# Patient Record
Sex: Male | Born: 1935 | Race: White | Hispanic: No | Marital: Married | Smoking: Never smoker
Health system: Southern US, Community
[De-identification: ages and names within clinical notes are randomized; demographics above are authoritative.]

## PROBLEM LIST (undated history)

## (undated) DIAGNOSIS — E7800 Pure hypercholesterolemia, unspecified: Secondary | ICD-10-CM

## (undated) DIAGNOSIS — I639 Cerebral infarction, unspecified: Secondary | ICD-10-CM

## (undated) DIAGNOSIS — K219 Gastro-esophageal reflux disease without esophagitis: Secondary | ICD-10-CM

## (undated) DIAGNOSIS — I4891 Unspecified atrial fibrillation: Secondary | ICD-10-CM

## (undated) DIAGNOSIS — R011 Cardiac murmur, unspecified: Secondary | ICD-10-CM

## (undated) DIAGNOSIS — Z974 Presence of external hearing-aid: Secondary | ICD-10-CM

## (undated) DIAGNOSIS — T753XXA Motion sickness, initial encounter: Secondary | ICD-10-CM

## (undated) DIAGNOSIS — J45909 Unspecified asthma, uncomplicated: Secondary | ICD-10-CM

## (undated) DIAGNOSIS — M1990 Unspecified osteoarthritis, unspecified site: Secondary | ICD-10-CM

## (undated) HISTORY — PX: LID LESION EXCISION: SHX5204

## (undated) HISTORY — PX: COLONOSCOPY: SHX174

---

## 2007-03-31 ENCOUNTER — Ambulatory Visit: Payer: Self-pay | Admitting: Internal Medicine

## 2008-02-21 ENCOUNTER — Ambulatory Visit: Payer: Self-pay | Admitting: Internal Medicine

## 2010-03-23 HISTORY — PX: TOTAL HIP ARTHROPLASTY: SHX124

## 2013-03-23 DIAGNOSIS — I639 Cerebral infarction, unspecified: Secondary | ICD-10-CM

## 2013-03-23 HISTORY — DX: Cerebral infarction, unspecified: I63.9

## 2015-10-08 ENCOUNTER — Encounter: Payer: Self-pay | Admitting: *Deleted

## 2015-10-11 NOTE — Discharge Instructions (Signed)

## 2015-10-14 ENCOUNTER — Encounter
Admission: RE | Disposition: A | Discharge status: Home / Self Care | Payer: Self-pay | Source: Ambulatory Visit | Attending: Ophthalmology

## 2015-10-14 ENCOUNTER — Ambulatory Visit
Admission: RE | Admit: 2015-10-14 | Discharge: 2015-10-14 | Disposition: A | Discharge status: Home / Self Care | Payer: Medicare HMO | Source: Ambulatory Visit | Attending: Ophthalmology | Admitting: Ophthalmology

## 2015-10-14 ENCOUNTER — Ambulatory Visit: Payer: Medicare HMO | Admitting: Student in an Organized Health Care Education/Training Program

## 2015-10-14 DIAGNOSIS — M1990 Unspecified osteoarthritis, unspecified site: Secondary | ICD-10-CM | POA: Diagnosis not present

## 2015-10-14 DIAGNOSIS — K219 Gastro-esophageal reflux disease without esophagitis: Secondary | ICD-10-CM | POA: Insufficient documentation

## 2015-10-14 DIAGNOSIS — I4891 Unspecified atrial fibrillation: Secondary | ICD-10-CM | POA: Diagnosis not present

## 2015-10-14 DIAGNOSIS — Z8673 Personal history of transient ischemic attack (TIA), and cerebral infarction without residual deficits: Secondary | ICD-10-CM | POA: Diagnosis not present

## 2015-10-14 DIAGNOSIS — H2512 Age-related nuclear cataract, left eye: Secondary | ICD-10-CM | POA: Insufficient documentation

## 2015-10-14 DIAGNOSIS — E7800 Pure hypercholesterolemia, unspecified: Secondary | ICD-10-CM | POA: Insufficient documentation

## 2015-10-14 DIAGNOSIS — Z7901 Long term (current) use of anticoagulants: Secondary | ICD-10-CM | POA: Insufficient documentation

## 2015-10-14 HISTORY — DX: Cerebral infarction, unspecified: I63.9

## 2015-10-14 HISTORY — DX: Motion sickness, initial encounter: T75.3XXA

## 2015-10-14 HISTORY — DX: Unspecified asthma, uncomplicated: J45.909

## 2015-10-14 HISTORY — DX: Unspecified atrial fibrillation: I48.91

## 2015-10-14 HISTORY — DX: Pure hypercholesterolemia, unspecified: E78.00

## 2015-10-14 HISTORY — DX: Unspecified osteoarthritis, unspecified site: M19.90

## 2015-10-14 HISTORY — DX: Gastro-esophageal reflux disease without esophagitis: K21.9

## 2015-10-14 HISTORY — PX: CATARACT EXTRACTION W/PHACO: SHX586

## 2015-10-14 HISTORY — DX: Cardiac murmur, unspecified: R01.1

## 2015-10-14 SURGERY — PHACOEMULSIFICATION, CATARACT, WITH IOL INSERTION
Anesthesia: Monitor Anesthesia Care | Site: Eye | Laterality: Left | Wound class: Clean

## 2015-10-14 MED ORDER — CEFUROXIME OPHTHALMIC INJECTION 1 MG/0.1 ML
INJECTION | OPHTHALMIC | Status: DC | PRN
  Administered 2015-10-14: 0.1 mL via INTRACAMERAL

## 2015-10-14 MED ORDER — EPINEPHRINE HCL 1 MG/ML IJ SOLN
INTRAOCULAR | Status: DC | PRN
  Administered 2015-10-14: 103 mL via OPHTHALMIC
  Administered 2015-10-14: 11:00:00 via OPHTHALMIC

## 2015-10-14 MED ORDER — ARMC OPHTHALMIC DILATING GEL
1.0000 | OPHTHALMIC | Status: DC | PRN
Start: 2015-10-14 — End: 2015-10-14
  Administered 2015-10-14 (×2): 1 via OPHTHALMIC

## 2015-10-14 MED ORDER — LACTATED RINGERS IV SOLN: INTRAVENOUS | Status: DC

## 2015-10-14 MED ORDER — MIDAZOLAM HCL 2 MG/2ML IJ SOLN
INTRAMUSCULAR | Status: DC | PRN
  Administered 2015-10-14: 2 mg via INTRAVENOUS

## 2015-10-14 MED ORDER — BRIMONIDINE TARTRATE 0.2 % OP SOLN
OPHTHALMIC | Status: DC | PRN
  Administered 2015-10-14: 1 [drp] via OPHTHALMIC

## 2015-10-14 MED ORDER — POVIDONE-IODINE 5 % OP SOLN
1.0000 "application " | OPHTHALMIC | Status: DC | PRN
  Administered 2015-10-14: 1 via OPHTHALMIC

## 2015-10-14 MED ORDER — TETRACAINE HCL 0.5 % OP SOLN
1.0000 [drp] | OPHTHALMIC | Status: DC | PRN
  Administered 2015-10-14: 1 [drp] via OPHTHALMIC

## 2015-10-14 MED ORDER — NA HYALUR & NA CHOND-NA HYALUR 0.4-0.35 ML IO KIT
PACK | INTRAOCULAR | Status: DC | PRN
  Administered 2015-10-14: 1 mL via INTRAOCULAR

## 2015-10-14 MED ORDER — TIMOLOL MALEATE 0.5 % OP SOLN
OPHTHALMIC | Status: DC | PRN
  Administered 2015-10-14: 1 [drp] via OPHTHALMIC

## 2015-10-14 MED ORDER — BALANCED SALT IO SOLN
INTRAOCULAR | Status: DC | PRN
  Administered 2015-10-14: 1 mL via OPHTHALMIC

## 2015-10-14 MED ORDER — FENTANYL CITRATE (PF) 100 MCG/2ML IJ SOLN
INTRAMUSCULAR | Status: DC | PRN
  Administered 2015-10-14: 50 ug via INTRAVENOUS

## 2015-10-14 SURGICAL SUPPLY — 29 items
APPLICATOR COTTON TIP 3IN (MISCELLANEOUS) ×3 IMPLANT
CANNULA ANT/CHMB 27GA (MISCELLANEOUS) IMPLANT
DISSECTOR HYDRO NUCLEUS 50X22 (MISCELLANEOUS) ×3 IMPLANT
GLOVE BIO SURGEON STRL SZ7 (GLOVE) ×3 IMPLANT
GLOVE SURG LX 6.5 MICRO (GLOVE) ×2
GLOVE SURG LX STRL 6.5 MICRO (GLOVE) ×1 IMPLANT
GOWN STRL REUS W/ TWL LRG LVL3 (GOWN DISPOSABLE) ×2 IMPLANT
GOWN STRL REUS W/TWL LRG LVL3 (GOWN DISPOSABLE) ×4
LENS IOL ACRSF IQ ULTRA 14.0 (Intraocular Lens) ×1 IMPLANT
LENS IOL ACRYSOF IQ 14.0 (Intraocular Lens) ×3 IMPLANT
MARKER SKIN DUAL TIP RULER LAB (MISCELLANEOUS) ×3 IMPLANT
NEEDLE FILTER BLUNT 18X 1/2SAF (NEEDLE) ×2
NEEDLE FILTER BLUNT 18X1 1/2 (NEEDLE) ×1 IMPLANT
PACK CATARACT BRASINGTON (MISCELLANEOUS) ×3 IMPLANT
PACK EYE AFTER SURG (MISCELLANEOUS) ×3 IMPLANT
PACK OPTHALMIC (MISCELLANEOUS) ×3 IMPLANT
RING MALYGIN 7.0 (MISCELLANEOUS) IMPLANT
SOL BAL SALT 15ML (MISCELLANEOUS)
SOLUTION BAL SALT 15ML (MISCELLANEOUS) IMPLANT
SUT ETHILON 10-0 CS-B-6CS-B-6 (SUTURE)
SUT VICRYL  9 0 (SUTURE)
SUT VICRYL 9 0 (SUTURE) IMPLANT
SUTURE EHLN 10-0 CS-B-6CS-B-6 (SUTURE) IMPLANT
SYR 3ML LL SCALE MARK (SYRINGE) ×3 IMPLANT
SYR TB 1ML LUER SLIP (SYRINGE) ×3 IMPLANT
WATER STERILE IRR 250ML POUR (IV SOLUTION) ×3 IMPLANT
WATER STERILE IRR 500ML POUR (IV SOLUTION) IMPLANT
WICK EYE OCUCEL (MISCELLANEOUS) IMPLANT
WIPE NON LINTING 3.25X3.25 (MISCELLANEOUS) ×3 IMPLANT

## 2015-10-14 NOTE — H&P (Signed)
H+P reviewed and is up to date, please see paper chart.  

## 2015-10-14 NOTE — Anesthesia Preprocedure Evaluation (Addendum)
Anesthesia Evaluation  Patient identified by MRN, date of birth, ID band Patient awake    Reviewed: Allergy & Precautions, NPO status , Patient's Chart, lab work & pertinent test results  Airway Mallampati: II  TM Distance: >3 FB Neck ROM: Full    Dental no notable dental hx.    Pulmonary neg pulmonary ROS,   Denies asthma   Pulmonary exam normal breath sounds clear to auscultation       Cardiovascular Normal cardiovascular exam+ dysrhythmias  Rhythm:Regular Rate:Normal  afib on coumadin hypercholesterolemia   Neuro/Psych CVA CVA negative psych ROS   GI/Hepatic negative GI ROS, Neg liver ROS, GERD  Controlled,  Endo/Other  negative endocrine ROS  Renal/GU negative Renal ROS  negative genitourinary   Musculoskeletal  (+) Arthritis ,   Abdominal   Peds negative pediatric ROS (+)  Hematology negative hematology ROS (+)   Anesthesia Other Findings   Reproductive/Obstetrics negative OB ROS                             Anesthesia Physical Anesthesia Plan  ASA: II  Anesthesia Plan: MAC   Post-op Pain Management:    Induction: Intravenous  Airway Management Planned:   Additional Equipment:   Intra-op Plan:   Post-operative Plan:   Informed Consent: I have reviewed the patients History and Physical, chart, labs and discussed the procedure including the risks, benefits and alternatives for the proposed anesthesia with the patient or authorized representative who has indicated his/her understanding and acceptance.   Dental advisory given  Plan Discussed with: CRNA  Anesthesia Plan Comments:        Anesthesia Quick Evaluation

## 2015-10-14 NOTE — Transfer of Care (Signed)
Immediate Anesthesia Transfer of Care Note  Patient: Cesar White  Procedure(s) Performed: Procedure(s) with comments: CATARACT EXTRACTION PHACO AND INTRAOCULAR LENS PLACEMENT (IOC) left eye (Left) - LEFT  Patient Location: PACU  Anesthesia Type: MAC  Level of Consciousness: awake, alert  and patient cooperative  Airway and Oxygen Therapy: Patient Spontanous Breathing and Patient connected to supplemental oxygen  Post-op Assessment: Post-op Vital signs reviewed, Patient's Cardiovascular Status Stable, Respiratory Function Stable, Patent Airway and No signs of Nausea or vomiting  Post-op Vital Signs: Reviewed and stable  Complications: No apparent anesthesia complications

## 2015-10-14 NOTE — Anesthesia Procedure Notes (Signed)
Procedure Name: MAC Performed by: Kaylob Wallen Pre-anesthesia Checklist: Patient identified, Emergency Drugs available, Suction available, Timeout performed and Patient being monitored Patient Re-evaluated:Patient Re-evaluated prior to inductionOxygen Delivery Method: Nasal cannula Placement Confirmation: positive ETCO2     

## 2015-10-14 NOTE — Op Note (Signed)
Date of Surgery: 10/14/2015  PREOPERATIVE DIAGNOSES: Visually significant nuclear sclerotic cataract, left eye.  POSTOPERATIVE DIAGNOSES: Same  PROCEDURES PERFORMED: Cataract extraction with intraocular lens implant, left eye.  SURGEON: Devin Going, M.D.  ANESTHESIA: MAC and topical  IMPLANTS: [id number] +14.0 D  Implant Name Type Inv. Item Serial No. Manufacturer Lot No. LRB No. Used  LENS IOL ACRYSOF IQ 14.0 - [id number] Intraocular Lens LENS IOL ACRYSOF IQ 14.0 [id number] ALCON   Left 1    COMPLICATIONS: None.  DESCRIPTION OF PROCEDURE: Therapeutic options were discussed with the patient preoperatively, including a discussion of risks and benefits of surgery. Informed consent was obtained. An IOL-Master and immersion biometry were used to take the lens measurements, and a dilated fundus exam was performed within 6 months of the surgical date.  The patient was premedicated and brought to the operating room and placed on the operating table in the supine position. After adequate anesthesia, the patient was prepped and draped in the usual sterile ophthalmic fashion. A wire lid speculum was inserted and the microscope was positioned. A Superblade was used to create a paracentesis site at the limbus and a small amount of dilute preservative free lidocaine was instilled into the anterior chamber, followed by dispersive viscoelastic. A clear corneal incision was created temporally using a 2.4 mm keratome blade. Capsulorrhexis was then performed. In situ phacoemulsification was performed.  Cortical material was removed with the irrigation-aspiration unit. Dispersive viscoelastic was instilled to open the capsular bag. A posterior chamber intraocular lens with the specifications above was inserted and positioned. Irrigation-aspiration was used to remove all viscoelastic. Cefuroxime 1cc was instilled into the anterior chamber, and the corneal incision was checked and found to be water tight. The  eyelid speculum was removed.  The operative eye was covered with protective goggles after instilling 1 drop of timolol and brimonidine. The patient tolerated the procedure well. There were no complications.

## 2015-10-14 NOTE — Anesthesia Postprocedure Evaluation (Signed)
Anesthesia Post Note  Patient: Cesar White  Procedure(s) Performed: Procedure(s) (LRB): CATARACT EXTRACTION PHACO AND INTRAOCULAR LENS PLACEMENT (IOC) left eye (Left)  Patient location during evaluation: PACU Anesthesia Type: MAC Level of consciousness: awake and alert Pain management: pain level controlled Vital Signs Assessment: post-procedure vital signs reviewed and stable Respiratory status: spontaneous breathing, nonlabored ventilation, respiratory function stable and patient connected to nasal cannula oxygen Cardiovascular status: stable and blood pressure returned to baseline Anesthetic complications: no    Sima Lindenberger C

## 2015-10-15 ENCOUNTER — Encounter: Payer: Self-pay | Admitting: Ophthalmology

## 2015-10-19 ENCOUNTER — Ambulatory Visit
Admission: EM | Admit: 2015-10-19 | Discharge: 2015-10-19 | Disposition: A | Discharge status: Home / Self Care | Payer: Medicare HMO | Attending: Emergency Medicine | Admitting: Emergency Medicine

## 2015-10-19 ENCOUNTER — Ambulatory Visit (INDEPENDENT_AMBULATORY_CARE_PROVIDER_SITE_OTHER): Payer: Medicare HMO

## 2015-10-19 DIAGNOSIS — M7751 Other enthesopathy of right foot: Secondary | ICD-10-CM

## 2015-10-19 NOTE — ED Provider Notes (Signed)
CSN: [id number]     Arrival date & time 10/19/15  1302 History   First MD Initiated Contact with Patient 10/19/15 1341     Chief Complaint  Patient presents with  . Ankle Pain    Right side   (Consider location/radiation/quality/duration/timing/severity/associated sxs/prior Treatment) HPI  This is an 82 year old male who presents with right posterior heel pain that began last week without injury remembers. He states that the pain is over the distal Achilles tendon at the insertion. He states he is able to stand on his foot comfortably but is soon as he begins to toe off he has the pain that is very severe. He has significant pes planus and wears orthotics and all issues. He states he most his lawn and works in the yard by picking up objects but has not had any injuries to his knowledge. He mows his lawn with a lawnmower that is right on. He does have frequent on and off and he encounters and obstruction.    Past Medical History:  Diagnosis Date  . Arthritis    left hip - prior to replacement  . Asthma    allergy induced  . Atrial fibrillation (HCC)   . GERD (gastroesophageal reflux disease)   . Heart murmur    heard when in HS. not mentioned since.  . Hypercholesteremia   . Motion sickness    deep sea fishing  . Stroke (HCC) 2015   No deficitis   Past Surgical History:  Procedure Laterality Date  . CATARACT EXTRACTION W/PHACO Left 10/14/2015   Procedure: CATARACT EXTRACTION PHACO AND INTRAOCULAR LENS PLACEMENT (IOC) left eye;  Surgeon: Sherald Hess, MD;  [facility];  Service: Ophthalmology;  Laterality: Left;  LEFT  . COLONOSCOPY    . LID LESION EXCISION Right   . TOTAL HIP ARTHROPLASTY Left 2012   Family History  Problem Relation Age of Onset  . Cancer Mother   . Heart failure Father    Social History  Substance Use Topics  . Smoking status: Never Smoker  . Smokeless tobacco: Never Used  . Alcohol use Yes     Comment: rare - 1 drink every  few months    Review of Systems  Constitutional: Positive for activity change. Negative for chills, fatigue and fever.  Musculoskeletal: Positive for gait problem and myalgias.  All other systems reviewed and are negative.   Allergies  Review of patient's allergies indicates no known allergies.  Home Medications   Prior to Admission medications   Medication Sig Start Date End Date Taking? Authorizing Provider  atorvastatin (LIPITOR) 10 MG tablet Take 10 mg by mouth daily.   Yes Historical Provider, MD  B Complex-C (SUPER B COMPLEX PO) Take by mouth daily.   Yes Historical Provider, MD  Cholecalciferol (VITAMIN D3) 2000 units TABS Take by mouth daily.   Yes Historical Provider, MD  fluticasone (FLONASE) 50 MCG/ACT nasal spray Place into both nostrils daily.   Yes Historical Provider, MD  Magnesium 400 MG TABS Take by mouth daily.   Yes Historical Provider, MD  montelukast (SINGULAIR) 10 MG tablet Take 10 mg by mouth at bedtime.   Yes Historical Provider, MD  Omega-3 Fatty Acids (FISH OIL) 1200 MG CAPS Take by mouth daily.   Yes Historical Provider, MD  ranitidine (ZANTAC) 150 MG capsule Take 150 mg by mouth daily.   Yes Historical Provider, MD  tamsulosin (FLOMAX) 0.4 MG CAPS capsule Take 0.4 mg by mouth daily after supper.   Yes  Historical Provider, MD  warfarin (COUMADIN) 10 MG tablet Take 10 mg by mouth daily.   Yes Historical Provider, MD   Meds Ordered and Administered this Visit  Medications - No data to display  BP (!) 160/79 (BP Location: Right Arm)   Pulse 81   Temp 97.7 F (36.5 C) (Oral)   Resp 16   Ht 6' (1.829 m)   Wt 245 lb (111.1 kg)   SpO2 95%   BMI 33.23 kg/m  No data found.   Physical Exam  Constitutional: He is oriented to person, place, and time. He appears well-developed and well-nourished. No distress.  HENT:  Head: Normocephalic and atraumatic.  Eyes: EOM are normal. Pupils are equal, round, and reactive to light. Right eye exhibits no discharge.  Left eye exhibits no discharge.  Neck: Normal range of motion. Neck supple.  Musculoskeletal: Normal range of motion. He exhibits tenderness. He exhibits no edema or deformity.  Extremities examination of the right foot shows a severe pes planus. The Achilles tendon appears to be intact and does not have any pain proximally but over the distal portion into the insertion on the calcaneus is tender. No deformity is palpable. Maximal tenderness appears to be at the retrocalcaneal bursa which reproduces pain. Patient has a marked antalgic gait not been able to toe off on the right foot due to the pain. Is a negative Thompson test  Neurological: He is alert and oriented to person, place, and time.  Skin: Skin is warm and dry. He is not diaphoretic.  Psychiatric: He has a normal mood and affect. His behavior is normal. Judgment and thought content normal.  Nursing note and vitals reviewed.   Urgent Care Course   Clinical Course    Procedures (including critical care time)  Labs Review Labs Reviewed - No data to display  Imaging Review Dg Foot Complete Right  Result Date: 10/19/2015 CLINICAL DATA:  Right Achilles tendon pain. EXAM: RIGHT FOOT COMPLETE - 3+ VIEW COMPARISON:  None. FINDINGS: A plantar spur is identified. Scattered degenerative changes. No fracture or dislocation. No cause for Achilles pain identified on this x-ray. IMPRESSION: No acute abnormalities. Electronically Signed   By: Gerome Sam III M.D   On: 10/19/2015 15:01    Visual Acuity Review  Right Eye Distance:   Left Eye Distance:   Bilateral Distance:    Right Eye Near:   Left Eye Near:    Bilateral Near:      Patient was fitted with a boot orthosis   MDM   1. Retrocalcaneal bursitis (back of heel), right    New Prescriptions   No medications on file  Plan: 1. Test/x-ray results and diagnosis reviewed with patient 2. rx as per orders; risks, benefits, potential side effects reviewed with patient 3.  Recommend supportive treatment with symptom avoidance. Recommended that he not to walk without the use of his orthotics as this may be putting additional pressure on the retrocalcaneal area causing his bursitis. I will provide him with a boot today for better stabilization treat him mostly mechanically since he is unable to take anti-inflammatories with his warfarin. He does have an appointment with Dr. Ether Griffins in a couple of weeks encouraged him to make this appointment as Dr. Ether Griffins may be able to provide him with additional therapy to help with his discomfort. 4. F/u prn if symptoms worsen or don't improve    Lutricia Feil, PA-C 10/19/15 1749

## 2015-10-19 NOTE — ED Triage Notes (Signed)
As per pt feels like achilles pain onset week with weight and ROM gets worst.

## 2015-11-21 ENCOUNTER — Encounter: Payer: Self-pay | Admitting: *Deleted

## 2015-11-29 NOTE — Discharge Instructions (Signed)

## 2015-12-02 ENCOUNTER — Ambulatory Visit: Payer: Medicare HMO | Admitting: Anesthesiology

## 2015-12-02 ENCOUNTER — Ambulatory Visit
Admission: RE | Admit: 2015-12-02 | Discharge: 2015-12-02 | Disposition: A | Discharge status: Home / Self Care | Payer: Medicare HMO | Source: Ambulatory Visit | Attending: Ophthalmology | Admitting: Ophthalmology

## 2015-12-02 ENCOUNTER — Encounter
Admission: RE | Disposition: A | Discharge status: Home / Self Care | Payer: Self-pay | Source: Ambulatory Visit | Attending: Ophthalmology

## 2015-12-02 DIAGNOSIS — Z8673 Personal history of transient ischemic attack (TIA), and cerebral infarction without residual deficits: Secondary | ICD-10-CM | POA: Insufficient documentation

## 2015-12-02 DIAGNOSIS — K219 Gastro-esophageal reflux disease without esophagitis: Secondary | ICD-10-CM | POA: Insufficient documentation

## 2015-12-02 DIAGNOSIS — J45909 Unspecified asthma, uncomplicated: Secondary | ICD-10-CM | POA: Insufficient documentation

## 2015-12-02 DIAGNOSIS — H2511 Age-related nuclear cataract, right eye: Secondary | ICD-10-CM | POA: Diagnosis present

## 2015-12-02 DIAGNOSIS — Z96649 Presence of unspecified artificial hip joint: Secondary | ICD-10-CM | POA: Insufficient documentation

## 2015-12-02 DIAGNOSIS — Z79899 Other long term (current) drug therapy: Secondary | ICD-10-CM | POA: Insufficient documentation

## 2015-12-02 DIAGNOSIS — Z7901 Long term (current) use of anticoagulants: Secondary | ICD-10-CM | POA: Diagnosis not present

## 2015-12-02 DIAGNOSIS — Z7951 Long term (current) use of inhaled steroids: Secondary | ICD-10-CM | POA: Diagnosis not present

## 2015-12-02 DIAGNOSIS — I4891 Unspecified atrial fibrillation: Secondary | ICD-10-CM | POA: Insufficient documentation

## 2015-12-02 HISTORY — PX: CATARACT EXTRACTION W/PHACO: SHX586

## 2015-12-02 SURGERY — PHACOEMULSIFICATION, CATARACT, WITH IOL INSERTION
Anesthesia: Monitor Anesthesia Care | Site: Eye | Laterality: Right | Wound class: Clean

## 2015-12-02 MED ORDER — MIDAZOLAM HCL 2 MG/2ML IJ SOLN
INTRAMUSCULAR | Status: DC | PRN
Start: 2015-12-02 — End: 2015-12-02
  Administered 2015-12-02: 2 mg via INTRAVENOUS

## 2015-12-02 MED ORDER — TETRACAINE HCL 0.5 % OP SOLN
1.0000 [drp] | OPHTHALMIC | Status: DC | PRN
  Administered 2015-12-02: 1 [drp] via OPHTHALMIC

## 2015-12-02 MED ORDER — LIDOCAINE HCL (PF) 4 % IJ SOLN
INTRAMUSCULAR | Status: DC | PRN
  Administered 2015-12-02: 1 mL via OPHTHALMIC

## 2015-12-02 MED ORDER — TIMOLOL MALEATE 0.5 % OP SOLN
OPHTHALMIC | Status: DC | PRN
  Administered 2015-12-02: 1 [drp] via OPHTHALMIC

## 2015-12-02 MED ORDER — EPINEPHRINE HCL 1 MG/ML IJ SOLN
INTRAOCULAR | Status: DC | PRN
  Administered 2015-12-02: 83 mL via OPHTHALMIC

## 2015-12-02 MED ORDER — BRIMONIDINE TARTRATE 0.2 % OP SOLN
OPHTHALMIC | Status: DC | PRN
  Administered 2015-12-02: 1 [drp] via OPHTHALMIC

## 2015-12-02 MED ORDER — LACTATED RINGERS IV SOLN: INTRAVENOUS | Status: DC

## 2015-12-02 MED ORDER — POVIDONE-IODINE 5 % OP SOLN
1.0000 "application " | OPHTHALMIC | Status: DC | PRN
  Administered 2015-12-02: 1 via OPHTHALMIC

## 2015-12-02 MED ORDER — NA HYALUR & NA CHOND-NA HYALUR 0.4-0.35 ML IO KIT
PACK | INTRAOCULAR | Status: DC | PRN
  Administered 2015-12-02: 1 mL via INTRAOCULAR

## 2015-12-02 MED ORDER — CEFUROXIME OPHTHALMIC INJECTION 1 MG/0.1 ML
INJECTION | OPHTHALMIC | Status: DC | PRN
  Administered 2015-12-02: 0.1 mL via OPHTHALMIC

## 2015-12-02 MED ORDER — ARMC OPHTHALMIC DILATING GEL
1.0000 "application " | OPHTHALMIC | Status: DC | PRN
  Administered 2015-12-02 (×2): 1 via OPHTHALMIC

## 2015-12-02 MED ORDER — FENTANYL CITRATE (PF) 100 MCG/2ML IJ SOLN
INTRAMUSCULAR | Status: DC | PRN
  Administered 2015-12-02: 50 ug via INTRAVENOUS

## 2015-12-02 SURGICAL SUPPLY — 29 items
APPLICATOR COTTON TIP 3IN (MISCELLANEOUS) ×3 IMPLANT
CANNULA ANT/CHMB 27GA (MISCELLANEOUS) ×3 IMPLANT
DISSECTOR HYDRO NUCLEUS 50X22 (MISCELLANEOUS) ×3 IMPLANT
GLOVE BIO SURGEON STRL SZ7 (GLOVE) ×3 IMPLANT
GLOVE SURG LX 6.5 MICRO (GLOVE) ×2
GLOVE SURG LX STRL 6.5 MICRO (GLOVE) ×1 IMPLANT
GOWN STRL REUS W/ TWL LRG LVL3 (GOWN DISPOSABLE) ×2 IMPLANT
GOWN STRL REUS W/TWL LRG LVL3 (GOWN DISPOSABLE) ×4
LENS IOL ACRSF IQ ULTRA 13.0 (Intraocular Lens) ×1 IMPLANT
LENS IOL ACRYSOF IQ 13.0 (Intraocular Lens) ×3 IMPLANT
MARKER SKIN DUAL TIP RULER LAB (MISCELLANEOUS) ×3 IMPLANT
NEEDLE FILTER BLUNT 18X 1/2SAF (NEEDLE) ×4
NEEDLE FILTER BLUNT 18X1 1/2 (NEEDLE) ×2 IMPLANT
PACK CATARACT BRASINGTON (MISCELLANEOUS) ×3 IMPLANT
PACK EYE AFTER SURG (MISCELLANEOUS) ×3 IMPLANT
PACK OPTHALMIC (MISCELLANEOUS) ×3 IMPLANT
RING MALYGIN 7.0 (MISCELLANEOUS) ×3 IMPLANT
SOL BAL SALT 15ML (MISCELLANEOUS)
SOLUTION BAL SALT 15ML (MISCELLANEOUS) IMPLANT
SUT ETHILON 10-0 CS-B-6CS-B-6 (SUTURE)
SUT VICRYL  9 0 (SUTURE)
SUT VICRYL 9 0 (SUTURE) IMPLANT
SUTURE EHLN 10-0 CS-B-6CS-B-6 (SUTURE) IMPLANT
SYR 3ML LL SCALE MARK (SYRINGE) ×6 IMPLANT
SYR TB 1ML LUER SLIP (SYRINGE) ×3 IMPLANT
WATER STERILE IRR 250ML POUR (IV SOLUTION) ×3 IMPLANT
WATER STERILE IRR 500ML POUR (IV SOLUTION) IMPLANT
WICK EYE OCUCEL (MISCELLANEOUS) IMPLANT
WIPE NON LINTING 3.25X3.25 (MISCELLANEOUS) ×3 IMPLANT

## 2015-12-02 NOTE — Transfer of Care (Signed)
Immediate Anesthesia Transfer of Care Note  Patient: Cesar White  Procedure(s) Performed: Procedure(s) with comments: CATARACT EXTRACTION PHACO AND INTRAOCULAR LENS PLACEMENT (IOC) (Right) - RIGHT  Patient Location: PACU  Anesthesia Type: MAC  Level of Consciousness: awake, alert  and patient cooperative  Airway and Oxygen Therapy: Patient Spontanous Breathing and Patient connected to supplemental oxygen  Post-op Assessment: Post-op Vital signs reviewed, Patient's Cardiovascular Status Stable, Respiratory Function Stable, Patent Airway and No signs of Nausea or vomiting  Post-op Vital Signs: Reviewed and stable  Complications: No apparent anesthesia complications

## 2015-12-02 NOTE — Op Note (Signed)
Date of Surgery: 12/02/2015  PREOPERATIVE DIAGNOSES: Visually significant nuclear sclerotic cataract, right eye.  POSTOPERATIVE DIAGNOSES: Same  PROCEDURES PERFORMED: Cataract extraction with intraocular lens implant, right eye with aid of Malyugin ring.  SURGEON: Devin GoingAnita P. Vin, M.D.  ANESTHESIA: MAC and topical  IMPLANTS: [id number] +13.0 D  Implant Name Type Inv. Item Serial No. Manufacturer Lot No. LRB No. Used  LENS IOL ACRYSOF IQ 13.0 - [id number] Intraocular Lens LENS IOL ACRYSOF IQ 13.0 [id number] [employer]CON   Right 1     COMPLICATIONS: None.  DESCRIPTION OF PROCEDURE: Therapeutic options were discussed with the patient preoperatively, including a discussion of risks and benefits of surgery. Informed consent was obtained. An IOL-Master and immersion biometry were used to take the lens measurements, and a dilated fundus exam was performed within 6 months of the surgical date.  The patient was premedicated and brought to the operating room and placed on the operating table in the supine position. After adequate anesthesia, the patient was prepped and draped in the usual sterile ophthalmic fashion. A wire lid speculum was inserted and the microscope was positioned. A Superblade was used to create a paracentesis site at the limbus and a small amount of dilute preservative free lidocaine was instilled into the anterior chamber, followed by dispersive viscoelastic. A clear corneal incision was created temporally using a 2.4 mm keratome blade.  The pupil size was roughly 4mm and so the decision was made to place a Malyugin ring. Capsulorrhexis was then performed. In situ phacoemulsification was performed.  Cortical material was removed with the irrigation-aspiration unit. Dispersive viscoelastic was instilled to open the capsular bag. A posterior chamber intraocular lens with the specifications above was inserted and positioned. The Malyugin ring was removed. Irrigation-aspiration was used to  remove all viscoelastic. Cefuroxime 1cc was instilled into the anterior chamber, and the corneal incision was checked and found to be water tight. The eyelid speculum was removed.  The operative eye was covered with protective goggles after instilling 1 drop of timolol and brimonidine. The patient tolerated the procedure well. There were no complications.

## 2015-12-02 NOTE — H&P (Signed)
H+P reviewed and is up to date, please see paper chart.  

## 2015-12-02 NOTE — Anesthesia Postprocedure Evaluation (Signed)
Anesthesia Post Note  Patient: Cesar White  Procedure(s) Performed: Procedure(s) (LRB): CATARACT EXTRACTION PHACO AND INTRAOCULAR LENS PLACEMENT (IOC) (Right)  Patient location during evaluation: PACU Anesthesia Type: MAC Level of consciousness: awake and alert and oriented Pain management: satisfactory to patient Vital Signs Assessment: post-procedure vital signs reviewed and stable Respiratory status: spontaneous breathing, nonlabored ventilation and respiratory function stable Cardiovascular status: blood pressure returned to baseline and stable Postop Assessment: Adequate PO intake and No signs of nausea or vomiting Anesthetic complications: no    Cherly BeachStella, Anurag Scarfo J

## 2015-12-02 NOTE — Anesthesia Procedure Notes (Signed)
Procedure Name: MAC Performed by: Georgeana Oertel Pre-anesthesia Checklist: Patient identified, Emergency Drugs available, Suction available, Timeout performed and Patient being monitored Patient Re-evaluated:Patient Re-evaluated prior to inductionOxygen Delivery Method: Nasal cannula Placement Confirmation: positive ETCO2       

## 2015-12-02 NOTE — Anesthesia Preprocedure Evaluation (Signed)
Anesthesia Evaluation  Patient identified by MRN, date of birth, ID band Patient awake    Reviewed: Allergy & Precautions, H&P , NPO status , Patient's Chart, lab work & pertinent test results  Airway Mallampati: II  TM Distance: >3 FB Neck ROM: full    Dental no notable dental hx.    Pulmonary asthma ,    Pulmonary exam normal        Cardiovascular Normal cardiovascular examAtrial Fibrillation      Neuro/Psych    GI/Hepatic GERD  ,  Endo/Other    Renal/GU      Musculoskeletal   Abdominal   Peds  Hematology   Anesthesia Other Findings   Reproductive/Obstetrics                             Anesthesia Physical Anesthesia Plan  ASA: II  Anesthesia Plan: MAC   Post-op Pain Management:    Induction:   Airway Management Planned:   Additional Equipment:   Intra-op Plan:   Post-operative Plan:   Informed Consent: I have reviewed the patients History and Physical, chart, labs and discussed the procedure including the risks, benefits and alternatives for the proposed anesthesia with the patient or authorized representative who has indicated his/her understanding and acceptance.     Plan Discussed with:   Anesthesia Plan Comments:         Anesthesia Quick Evaluation

## 2016-02-09 ENCOUNTER — Encounter: Payer: Self-pay | Admitting: Emergency Medicine

## 2016-02-09 ENCOUNTER — Ambulatory Visit (INDEPENDENT_AMBULATORY_CARE_PROVIDER_SITE_OTHER): Payer: Medicare HMO

## 2016-02-09 ENCOUNTER — Ambulatory Visit
Admission: EM | Admit: 2016-02-09 | Discharge: 2016-02-09 | Disposition: A | Discharge status: Home / Self Care | Payer: Medicare HMO | Attending: Family Medicine | Admitting: Family Medicine

## 2016-02-09 DIAGNOSIS — S2232XA Fracture of one rib, left side, initial encounter for closed fracture: Secondary | ICD-10-CM | POA: Diagnosis not present

## 2016-02-09 DIAGNOSIS — J9811 Atelectasis: Secondary | ICD-10-CM

## 2016-02-09 MED ORDER — CELECOXIB 200 MG PO CAPS: 200.0000 mg | ORAL_CAPSULE | Freq: Every day | ORAL | 0 refills | Status: DC

## 2016-02-09 NOTE — ED Provider Notes (Signed)
[facility]    CSN: [id number] Arrival date & time: 02/09/16  1554     History   Chief Complaint Chief Complaint  Patient presents with  . Fall  . Rib pain    left    HPI Cesar White is a 82 y.o. male.   Patient reports Thursday getting up in the night as he usually does when his foot hit the iron leg of Augmentin at this foot of the bed. He states was falling onto the floor. This carcinoma floor and he received a carpet burn on his right knee apparently hit his left chest. Since then he reports pain when he coughs sneezes and sometimes when he takes deep breaths. He is on Coumadin for A. fib and he states that he goes in and out of A. fib. History arthritis asthma, and hyperlipidemia. He's had a history of stroke before. He's had cataract extraction total hip replacement as well.   The history is provided by the patient. No language interpreter was used.  Fall  This is a new problem. The problem occurs constantly. The problem has not changed since onset.Associated symptoms include chest pain and shortness of breath. Pertinent negatives include no abdominal pain and no headaches. Nothing aggravates the symptoms. Nothing relieves the symptoms. He has tried nothing for the symptoms. The treatment provided no relief.    Past Medical History:  Diagnosis Date  . Arthritis    left hip - prior to replacement  . Asthma    allergy induced  . Atrial fibrillation (HCC)   . GERD (gastroesophageal reflux disease)   . Heart murmur    heard when in HS. not mentioned since.  . Hypercholesteremia   . Motion sickness    deep sea fishing  . Stroke (HCC) 2015   No deficitis    There are no active problems to display for this patient.   Past Surgical History:  Procedure Laterality Date  . CATARACT EXTRACTION W/PHACO Left 10/14/2015   Procedure: CATARACT EXTRACTION PHACO AND INTRAOCULAR LENS PLACEMENT (IOC) left eye;  Surgeon: Sherald HessAnita Prakash Vin-Parikh, MD;  [facility];  Service: Ophthalmology;  Laterality: Left;  LEFT  . CATARACT EXTRACTION W/PHACO Right 12/02/2015   Procedure: CATARACT EXTRACTION PHACO AND INTRAOCULAR LENS PLACEMENT (IOC);  Surgeon: Sherald HessAnita Prakash Vin-Parikh, MD;  [facility];  Service: Ophthalmology;  Laterality: Right;  RIGHT  . COLONOSCOPY    . LID LESION EXCISION Right   . TOTAL HIP ARTHROPLASTY Left 2012       Home Medications    Prior to Admission medications   Medication Sig Start Date End Date Taking? Authorizing Provider  atorvastatin (LIPITOR) 10 MG tablet Take 10 mg by mouth daily.    Historical Provider, MD  B Complex-C (SUPER B COMPLEX PO) Take by mouth daily.    Historical Provider, MD  celecoxib (CELEBREX) 200 MG capsule Take 1 capsule (200 mg total) by mouth daily after breakfast. 02/09/16   Hassan RowanEugene Devaney Segers, MD  Cholecalciferol (VITAMIN D3) 2000 units TABS Take by mouth daily.    Historical Provider, MD  fluticasone (FLONASE) 50 MCG/ACT nasal spray Place into both nostrils daily.    Historical Provider, MD  Magnesium 400 MG TABS Take by mouth daily.    Historical Provider, MD  montelukast (SINGULAIR) 10 MG tablet Take 10 mg by mouth at bedtime.    Historical Provider, MD  Omega-3 Fatty Acids (FISH OIL) 1200 MG CAPS Take by mouth daily.    Historical  Provider, MD  ranitidine (ZANTAC) 150 MG capsule Take 150 mg by mouth daily.    Historical Provider, MD  tamsulosin (FLOMAX) 0.4 MG CAPS capsule Take 0.4 mg by mouth daily after supper.    Historical Provider, MD  warfarin (COUMADIN) 10 MG tablet Take 10 mg by mouth daily.    Historical Provider, MD    Family History Family History  Problem Relation Age of Onset  . Cancer Mother   . Heart failure Father     Social History Social History  Substance Use Topics  . Smoking status: Never Smoker  . Smokeless tobacco: Never Used  . Alcohol use Yes     Comment: rare - 1 drink every few months     Allergies   Patient has no known  allergies.   Review of Systems Review of Systems  Respiratory: Positive for shortness of breath.   Cardiovascular: Positive for chest pain.  Gastrointestinal: Negative for abdominal pain.  Neurological: Negative for headaches.  All other systems reviewed and are negative.    Physical Exam Triage Vital Signs ED Triage Vitals  Enc Vitals Group     BP 02/09/16 1615 (S) (!) 166/83     Pulse Rate 02/09/16 1615 77     Resp 02/09/16 1615 17     Temp 02/09/16 1615 97.6 F (36.4 C)     Temp Source 02/09/16 1615 Tympanic     SpO2 02/09/16 1615 95 %     Weight 02/09/16 1616 245 lb (111.1 kg)     Height 02/09/16 1616 6' (1.829 m)     Head Circumference --      Peak Flow --      Pain Score 02/09/16 1617 8     Pain Loc --      Pain Edu? --      Excl. in GC? --    No data found.   Updated Vital Signs BP (S) (!) 166/83 (BP Location: Right Arm)   Pulse 77   Temp 97.6 F (36.4 C) (Tympanic)   Resp 17   Ht 6' (1.829 m)   Wt 245 lb (111.1 kg)   SpO2 95%   BMI 33.23 kg/m   Visual Acuity Right Eye Distance:   Left Eye Distance:   Bilateral Distance:    Right Eye Near:   Left Eye Near:    Bilateral Near:     Physical Exam  Constitutional: He is oriented to person, place, and time. He appears well-developed and well-nourished.  HENT:  Head: Normocephalic and atraumatic.  Eyes: EOM are normal. Pupils are equal, round, and reactive to light.  Neck: Normal range of motion. Neck supple.  Cardiovascular: Normal rate and regular rhythm.   Pulmonary/Chest: Effort normal and breath sounds normal. No respiratory distress.  Patient with tenderness over the left mid anterior and axillary rib area  Musculoskeletal: Normal range of motion.  Neurological: He is alert and oriented to person, place, and time.  Skin: Skin is warm.  Psychiatric: He has a normal mood and affect.  Vitals reviewed.    UC Treatments / Results  Labs (all labs ordered are listed, but only abnormal results  are displayed) Labs Reviewed - No data to display  EKG  EKG Interpretation None       Radiology Dg Ribs Unilateral W/chest Left  Result Date: 02/09/2016 CLINICAL DATA:  Pain following fall 3 days prior EXAM: LEFT RIBS AND CHEST - 3+ VIEW COMPARISON:  None. FINDINGS: Frontal chest as well as oblique  and cone-down lower rib images were obtained. There is mild bibasilar atelectasis. Lungs elsewhere are clear. Heart size and pulmonary vascularity are normal. No adenopathy. There is no pneumothorax or pleural effusion. There is a subtle incomplete fracture along the anterior left seventh rib. IMPRESSION: Subtle incomplete fracture anterior left seventh rib. Alignment anatomic. No other fracture evident. There is mild bibasilar atelectasis. Lungs elsewhere clear. No pneumothorax. Electronically Signed   By: Bretta BangWilliam  Woodruff III M.D.   On: 02/09/2016 16:48    Procedures Procedures (including critical care time)  Medications Ordered in UC Medications - No data to display   Initial Impression / Assessment and Plan / UC Course  I have reviewed the triage vital signs and the nursing notes.  Pertinent labs & imaging results that were available during my care of the patient were reviewed by me and considered in my medical decision making (see chart for details).  Clinical Course     We'll place on Celebrex 200 mg he is on a blood thinner explained to him why he needs be careful with blood thinners and anti-inflammatories. He has been taking Motrin direct F recommend Celebrex over the Motrin at this time. Discussed with him any narcotic he declines at this point in time but recommend he strongly takes deep breaths to keep atelectasis getting worse. Follow-up with PCP in about a week to 2 weeks.  Final Clinical Impressions(s) / UC Diagnoses   Final diagnoses:  Closed fracture of one rib of left side, initial encounter  Mild basilar atelectasis of both lungs    New Prescriptions New  Prescriptions   CELECOXIB (CELEBREX) 200 MG CAPSULE    Take 1 capsule (200 mg total) by mouth daily after breakfast.     Hassan RowanEugene Ellora Varnum, MD 02/09/16 1723

## 2016-02-09 NOTE — ED Triage Notes (Signed)
Patient states that he fell and hit the left side of his chest on the floor on Thursday night.  Patient c/o left sided rib pain.  Patient is on warfarin. Patient denies SOB or difficulty breathing.

## 2019-01-17 ENCOUNTER — Other Ambulatory Visit: Payer: Self-pay | Admitting: Surgery

## 2019-01-17 DIAGNOSIS — M1711 Unilateral primary osteoarthritis, right knee: Secondary | ICD-10-CM

## 2019-01-17 DIAGNOSIS — S83231D Complex tear of medial meniscus, current injury, right knee, subsequent encounter: Secondary | ICD-10-CM

## 2019-01-25 ENCOUNTER — Ambulatory Visit
Admission: RE | Admit: 2019-01-25 | Discharge: 2019-01-25 | Disposition: A | Discharge status: Home / Self Care | Payer: Medicare HMO | Source: Ambulatory Visit | Attending: Surgery | Admitting: Surgery

## 2019-01-25 ENCOUNTER — Other Ambulatory Visit: Payer: Self-pay

## 2019-01-25 DIAGNOSIS — M1711 Unilateral primary osteoarthritis, right knee: Secondary | ICD-10-CM | POA: Insufficient documentation

## 2019-01-25 DIAGNOSIS — S83231D Complex tear of medial meniscus, current injury, right knee, subsequent encounter: Secondary | ICD-10-CM | POA: Diagnosis present

## 2019-01-27 ENCOUNTER — Ambulatory Visit: Payer: Medicare HMO

## 2019-02-02 ENCOUNTER — Other Ambulatory Visit: Payer: Self-pay | Admitting: Surgery

## 2019-02-24 ENCOUNTER — Other Ambulatory Visit
Admission: RE | Admit: 2019-02-24 | Discharge: 2019-02-24 | Disposition: A | Discharge status: Home / Self Care | Payer: MEDICARE | Source: Ambulatory Visit | Attending: Surgery | Admitting: Surgery

## 2019-02-24 ENCOUNTER — Other Ambulatory Visit: Payer: Self-pay

## 2019-02-24 DIAGNOSIS — Z01810 Encounter for preprocedural cardiovascular examination: Secondary | ICD-10-CM | POA: Insufficient documentation

## 2019-02-24 DIAGNOSIS — I4891 Unspecified atrial fibrillation: Secondary | ICD-10-CM | POA: Diagnosis not present

## 2019-02-24 NOTE — Patient Instructions (Addendum)
Your procedure is scheduled on: 03-02-19 THURSDAY Report to Same Day Surgery 2nd floor medical mall [facility] Entrance-take elevator on left to 2nd floor.  Check in with surgery information desk.) To find out your arrival time please call [phone] between 1PM - 3PM on 03-01-19 [facility]  Remember: Instructions that are not followed completely may result in serious medical risk, up to and including death, or upon the discretion of your surgeon and anesthesiologist your surgery may need to be rescheduled.    _x___ 1. Do not eat food after midnight the night before your procedure. NO GUM OR CANDY AFTER MIDNIGHT. You may drink clear liquids up to 2 hours before you are scheduled to arrive at the hospital for your procedure.  Do not drink clear liquids within 2 hours of your scheduled arrival to the hospital.  Clear liquids include  --Water or Apple juice without pulp  --Gatorade  --Black Coffee or Clear Tea (No milk, no creamers, do not add anything to the coffee or Tea   ____Ensure clear carbohydrate drink on the way to the hospital for bariatric patients  _X___Ensure clear carbohydrate drink 3 hours PRIOR TO ARRIVAL TIME TO HOSPITAL   __x__ 2. No Alcohol for 24 hours before or after surgery.   __x__3. No Smoking or e-cigarettes for 24 prior to surgery.  Do not use any chewable tobacco products for at least 6 hour prior to surgery   ____  4. Bring all medications with you on the day of surgery if instructed.    __x__ 5. Notify your doctor if there is any change in your medical condition     (cold, fever, infections).    x___6. On the morning of surgery brush your teeth with toothpaste and water.  You may rinse your mouth with mouth wash if you wish.  Do not swallow any toothpaste or mouthwash.   Do not wear jewelry, make-up, hairpins, clips or nail polish.  Do not wear lotions, powders, or perfumes.  Do not shave 48 hours prior to surgery. Men may shave face and neck.  Do not  bring valuables to the hospital.    [facility] is not responsible for any belongings or valuables.               Contacts, dentures or bridgework may not be worn into surgery.  Leave your suitcase in the car. After surgery it may be brought to your room.  For patients admitted to the hospital, discharge time is determined by your treatment team.  _  Patients discharged the day of surgery will not be allowed to drive home.  You will need someone to drive you home and stay with you the night of your procedure.    Please read over the following fact sheets that you were given:   [facility] Preparing for Surgery and or MRSA Information   _x___ TAKE THE FOLLOWING MEDICATION THE MORNING OF SURGERY WITH A SMALL SIP OF WATER. These include:  1. PEPCID (FAMOTIDINE)  2.  3.  4.  5.  6.  ____Fleets enema or Magnesium Citrate as directed.   _x___ Use CHG Soap or sage wipes as directed on instruction sheet   ____ Use inhalers on the day of surgery and bring to hospital day of surgery  ____ Stop Metformin and Janumet 2 days prior to surgery.    ____ Take 1/2 of usual insulin dose the night before surgery and none on the morning surgery.   _x___ Follow recommendations  from Cardiologist, Pulmonologist or PCP regarding stopping Aspirin, Coumadin, Plavix ,Eliquis, Effient, or Pradaxa, and Pletal-ASK DR POGGI ABOUT WHEN TO STOP YOUR WARFARIN (COUMADIN) [phone]  X____Stop Anti-inflammatories such as Advil, Aleve, Ibuprofen, Motrin, Naproxen, Naprosyn, Goodies powders or aspirin products NOW-OK to take Tylenol OR HYDROCODONE OR TRAMADOL IF NEEDED   _x___ Stop supplements until after surgery-STOP FISH OIL NOW-MAY RESUME AFTER SURGERY   ____ Bring C-Pap to the hospital.

## 2019-02-27 ENCOUNTER — Other Ambulatory Visit: Payer: Self-pay

## 2019-02-27 ENCOUNTER — Other Ambulatory Visit
Admission: RE | Admit: 2019-02-27 | Discharge: 2019-02-27 | Disposition: A | Discharge status: Home / Self Care | Payer: MEDICARE | Source: Ambulatory Visit | Attending: Surgery | Admitting: Surgery

## 2019-02-27 DIAGNOSIS — Z20828 Contact with and (suspected) exposure to other viral communicable diseases: Secondary | ICD-10-CM | POA: Diagnosis not present

## 2019-02-27 DIAGNOSIS — Z01812 Encounter for preprocedural laboratory examination: Secondary | ICD-10-CM | POA: Insufficient documentation

## 2019-02-27 LAB — SARS CORONAVIRUS 2 (TAT 6-24 HRS): SARS Coronavirus 2: NEGATIVE

## 2019-03-02 ENCOUNTER — Other Ambulatory Visit: Payer: Self-pay

## 2019-03-02 ENCOUNTER — Ambulatory Visit: Payer: Medicare HMO

## 2019-03-02 ENCOUNTER — Encounter: Payer: Self-pay | Admitting: Surgery

## 2019-03-02 ENCOUNTER — Encounter
Admission: RE | Disposition: A | Discharge status: Home / Self Care | Payer: Self-pay | Source: Home / Self Care | Attending: Surgery

## 2019-03-02 ENCOUNTER — Ambulatory Visit: Payer: Medicare HMO | Admitting: Anesthesiology

## 2019-03-02 ENCOUNTER — Ambulatory Visit
Admission: RE | Admit: 2019-03-02 | Discharge: 2019-03-02 | Disposition: A | Discharge status: Home / Self Care | Payer: Medicare HMO | Attending: Surgery | Admitting: Surgery

## 2019-03-02 DIAGNOSIS — E669 Obesity, unspecified: Secondary | ICD-10-CM | POA: Diagnosis not present

## 2019-03-02 DIAGNOSIS — Z96642 Presence of left artificial hip joint: Secondary | ICD-10-CM | POA: Insufficient documentation

## 2019-03-02 DIAGNOSIS — S83241A Other tear of medial meniscus, current injury, right knee, initial encounter: Secondary | ICD-10-CM | POA: Diagnosis present

## 2019-03-02 DIAGNOSIS — I480 Paroxysmal atrial fibrillation: Secondary | ICD-10-CM | POA: Diagnosis not present

## 2019-03-02 DIAGNOSIS — Z6832 Body mass index (BMI) 32.0-32.9, adult: Secondary | ICD-10-CM | POA: Insufficient documentation

## 2019-03-02 DIAGNOSIS — M94261 Chondromalacia, right knee: Secondary | ICD-10-CM | POA: Diagnosis not present

## 2019-03-02 DIAGNOSIS — M1711 Unilateral primary osteoarthritis, right knee: Secondary | ICD-10-CM | POA: Diagnosis not present

## 2019-03-02 DIAGNOSIS — Z8673 Personal history of transient ischemic attack (TIA), and cerebral infarction without residual deficits: Secondary | ICD-10-CM | POA: Insufficient documentation

## 2019-03-02 DIAGNOSIS — Z7901 Long term (current) use of anticoagulants: Secondary | ICD-10-CM | POA: Diagnosis not present

## 2019-03-02 DIAGNOSIS — Z79899 Other long term (current) drug therapy: Secondary | ICD-10-CM | POA: Diagnosis not present

## 2019-03-02 DIAGNOSIS — K219 Gastro-esophageal reflux disease without esophagitis: Secondary | ICD-10-CM | POA: Insufficient documentation

## 2019-03-02 DIAGNOSIS — Z419 Encounter for procedure for purposes other than remedying health state, unspecified: Secondary | ICD-10-CM

## 2019-03-02 DIAGNOSIS — M2341 Loose body in knee, right knee: Secondary | ICD-10-CM | POA: Diagnosis not present

## 2019-03-02 DIAGNOSIS — X58XXXA Exposure to other specified factors, initial encounter: Secondary | ICD-10-CM | POA: Diagnosis not present

## 2019-03-02 DIAGNOSIS — M84461A Pathological fracture, right tibia, initial encounter for fracture: Secondary | ICD-10-CM | POA: Diagnosis not present

## 2019-03-02 DIAGNOSIS — E7800 Pure hypercholesterolemia, unspecified: Secondary | ICD-10-CM | POA: Insufficient documentation

## 2019-03-02 HISTORY — PX: KNEE ARTHROSCOPY WITH SUBCHONDROPLASTY: SHX6732

## 2019-03-02 LAB — PROTIME-INR
INR: 1.2 (ref 0.8–1.2)
Prothrombin Time: 14.9 seconds (ref 11.4–15.2)

## 2019-03-02 SURGERY — ARTHROSCOPY, KNEE, WITH SUBCHONDROPLASTY
Anesthesia: General | Site: Knee | Laterality: Right

## 2019-03-02 MED ORDER — HYDROCODONE-ACETAMINOPHEN 5-325 MG PO TABS: 1.0000 | ORAL_TABLET | Freq: Four times a day (QID) | ORAL | 0 refills | Status: DC | PRN

## 2019-03-02 MED ORDER — PROPOFOL 10 MG/ML IV BOLUS
INTRAVENOUS | Status: AC
  Filled 2019-03-02: qty 20

## 2019-03-02 MED ORDER — LIDOCAINE HCL (PF) 1 % IJ SOLN
INTRAMUSCULAR | Status: DC | PRN
  Administered 2019-03-02: 30 mL

## 2019-03-02 MED ORDER — ONDANSETRON HCL 4 MG/2ML IJ SOLN
INTRAMUSCULAR | Status: AC
  Filled 2019-03-02: qty 2

## 2019-03-02 MED ORDER — CHLORHEXIDINE GLUCONATE 4 % EX LIQD
60.0000 mL | Freq: Once | CUTANEOUS | Status: AC
  Administered 2019-03-02: 4 via TOPICAL

## 2019-03-02 MED ORDER — FENTANYL CITRATE (PF) 100 MCG/2ML IJ SOLN
INTRAMUSCULAR | Status: AC
  Filled 2019-03-02: qty 2

## 2019-03-02 MED ORDER — LIDOCAINE HCL (PF) 2 % IJ SOLN
INTRAMUSCULAR | Status: AC
  Filled 2019-03-02: qty 10

## 2019-03-02 MED ORDER — CEFAZOLIN SODIUM-DEXTROSE 2-4 GM/100ML-% IV SOLN
INTRAVENOUS | Status: AC
  Filled 2019-03-02: qty 100

## 2019-03-02 MED ORDER — ONDANSETRON HCL 4 MG/2ML IJ SOLN
INTRAMUSCULAR | Status: DC | PRN
  Administered 2019-03-02: 4 mg via INTRAVENOUS

## 2019-03-02 MED ORDER — PROPOFOL 10 MG/ML IV BOLUS
INTRAVENOUS | Status: DC | PRN
  Administered 2019-03-02: 40 mg via INTRAVENOUS
  Administered 2019-03-02: 160 mg via INTRAVENOUS

## 2019-03-02 MED ORDER — CEFAZOLIN SODIUM-DEXTROSE 2-4 GM/100ML-% IV SOLN
2.0000 g | INTRAVENOUS | Status: AC
  Administered 2019-03-02: 2 g via INTRAVENOUS

## 2019-03-02 MED ORDER — SEVOFLURANE IN SOLN
RESPIRATORY_TRACT | Status: AC
  Filled 2019-03-02: qty 250

## 2019-03-02 MED ORDER — FENTANYL CITRATE (PF) 100 MCG/2ML IJ SOLN
INTRAMUSCULAR | Status: DC | PRN
  Administered 2019-03-02: 50 ug via INTRAVENOUS
  Administered 2019-03-02 (×2): 25 ug via INTRAVENOUS
  Administered 2019-03-02 (×2): 50 ug via INTRAVENOUS

## 2019-03-02 MED ORDER — BUPIVACAINE-EPINEPHRINE (PF) 0.5% -1:200000 IJ SOLN
INTRAMUSCULAR | Status: AC
  Filled 2019-03-02: qty 30

## 2019-03-02 MED ORDER — BUPIVACAINE-EPINEPHRINE (PF) 0.5% -1:200000 IJ SOLN
INTRAMUSCULAR | Status: DC | PRN
  Administered 2019-03-02: 60 mL

## 2019-03-02 MED ORDER — LACTATED RINGERS IV SOLN
INTRAVENOUS | Status: DC
  Administered 2019-03-02: 12:00:00 via INTRAVENOUS

## 2019-03-02 MED ORDER — LIDOCAINE HCL (CARDIAC) PF 100 MG/5ML IV SOSY
PREFILLED_SYRINGE | INTRAVENOUS | Status: DC | PRN
  Administered 2019-03-02: 100 mg via INTRAVENOUS

## 2019-03-02 MED ORDER — LIDOCAINE HCL (PF) 1 % IJ SOLN
INTRAMUSCULAR | Status: AC
  Filled 2019-03-02: qty 30

## 2019-03-02 SURGICAL SUPPLY — 33 items
BLADE FULL RADIUS 3.5 (BLADE) ×3 IMPLANT
BLADE SHAVER 4.5X7 STR FR (MISCELLANEOUS) ×3 IMPLANT
BNDG ELASTIC 6X5.8 VLCR STR LF (GAUZE/BANDAGES/DRESSINGS) ×3 IMPLANT
CHLORAPREP W/TINT 26 (MISCELLANEOUS) ×3 IMPLANT
COVER WAND RF STERILE (DRAPES) ×3 IMPLANT
CUFF TOURN 24 STER (MISCELLANEOUS) IMPLANT
CUFF TOURN 30 STER DUAL PORT (MISCELLANEOUS) IMPLANT
CUFF TOURN SGL QUICK 30 (TOURNIQUET CUFF) ×2
CUFF TRNQT CYL 30X4X21-28X (TOURNIQUET CUFF) ×1 IMPLANT
DRAPE C-ARM XRAY 36X54 (DRAPES) ×3 IMPLANT
ELECT REM PT RETURN 9FT ADLT (ELECTROSURGICAL) ×3
ELECTRODE REM PT RTRN 9FT ADLT (ELECTROSURGICAL) ×1 IMPLANT
GAUZE SPONGE 4X4 12PLY STRL (GAUZE/BANDAGES/DRESSINGS) ×3 IMPLANT
GLOVE BIO SURGEON STRL SZ8 (GLOVE) ×6 IMPLANT
GLOVE INDICATOR 8.0 STRL GRN (GLOVE) ×3 IMPLANT
GOWN STRL REUS W/ TWL LRG LVL3 (GOWN DISPOSABLE) ×1 IMPLANT
GOWN STRL REUS W/ TWL XL LVL3 (GOWN DISPOSABLE) ×1 IMPLANT
GOWN STRL REUS W/TWL LRG LVL3 (GOWN DISPOSABLE) ×2
GOWN STRL REUS W/TWL XL LVL3 (GOWN DISPOSABLE) ×2
IV LACTATED RINGER IRRG 3000ML (IV SOLUTION) ×4
IV LR IRRIG 3000ML ARTHROMATIC (IV SOLUTION) ×2 IMPLANT
KIT ACCUFILL 5CC (Knees) ×1 IMPLANT
KIT KNEE SCP 414.502 (Knees) ×3 IMPLANT
KIT TURNOVER KIT A (KITS) ×3 IMPLANT
MANIFOLD NEPTUNE II (INSTRUMENTS) ×3 IMPLANT
NEEDLE HYPO 21X1.5 SAFETY (NEEDLE) ×3 IMPLANT
PACK ARTHROSCOPY KNEE (MISCELLANEOUS) ×3 IMPLANT
PENCIL ELECTRO HAND CTR (MISCELLANEOUS) IMPLANT
SUT PROLENE 4 0 PS 2 18 (SUTURE) IMPLANT
SUT TI-CRON 2-0 W/10 SWGD (SUTURE) IMPLANT
SYR 50ML LL SCALE MARK (SYRINGE) ×3 IMPLANT
TUBING ARTHRO INFLOW-ONLY STRL (TUBING) ×3 IMPLANT
WAND WEREWOLF FLOW 90D (MISCELLANEOUS) ×3 IMPLANT

## 2019-03-02 NOTE — Op Note (Signed)
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  Post-Op Diagnosis:   Same with loose body, right knee.  Procedure:   Arthroscopic debridement with partial medial meniscectomy, abrasion chondroplasty of grade III-IV chondromalacia of medial femoral condyle, removal of loose body, and subchondroplasty of insufficiency fracture medial femoral condyle, right knee.  Surgeon:   Maryagnes Amos, M.D.  Anesthesia:   General LMA.  Findings:   As above.  There were several areas of focal grade 3-4 chondromalacia involving the weightbearing portion of the medial femoral condyle, as well as a complex radial tear involving the posteromedial portion of the medial meniscus.  The lateral meniscus was in satisfactory condition, as were the anterior posterior cruciate ligaments.  The articular surfaces of the lateral femoral condyle, medial and lateral tibial plateaus, and femoral trochlea all were in satisfactory condition.  Complications:   None.  EBL:   5 cc.  Total fluids:   400 cc of crystalloid.  Tourniquet time:   None  Drains:   None  Closure:   4-0 Prolene interrupted sutures.  Brief clinical note:   The patient is an 85 year old male with a 3 to 46-month history of progressively worsening medial sided right knee pain. His symptoms have persisted despite medications, activity modification, a steroid injection, etc. His history and examination were consistent with a probable medial meniscus tear. His preoperative MRI scan confirmed the presence of a medial meniscus tear, as well as some moderate focal degenerative changes involve the medial femoral condyle. It also demonstrated increased bone marrow edema involving the medial femoral condyle and medial tibial plateau with an insufficiency fracture identified in the medial tibial plateau. The patient presents at  this time for arthroscopy, debridement, partial medial meniscectomy, and subchondroplasty of the insufficiency fracture involving the medial tibial plateau.  Procedure:   The patient was brought into the operating room and lain in the supine position. After adequate general laryngeal mask anesthesia was obtained, a timeout was performed to verify the appropriate side. The patient's right knee was injected sterilely using a solution of 30 cc of 1% lidocaine and 30 cc of 0.5% Sensorcaine with epinephrine. The right lower extremity was prepped with ChloraPrep solution before being draped sterilely. Preoperative antibiotics were administered. The expected portal sites and expected insertion site for the subchondroplasty trocar were injected with 0.5% Sensorcaine with epinephrine. After assessing the entry site for the trocar utilizing fluoroscopic imaging in AP and lateral projections, the trocar was inserted to the appropriate depth. Again, its position was verified fluoroscopically in AP and lateral projections. A total of 4.5 cc of calcium phosphate bone cement was injected into the cancellous portion of the medial tibial plateau, rotating the trocar 90 degrees each time. The trocar was left in place while the bone cement hardened. Once the cement had hardened for 8 minutes, the trocar was removed and the adequacy of cement distribution verified fluoroscopically in AP and lateral projections and found to be excellent.  The arthroscopic portion of the procedure was next begun. The camera was placed in the anterolateral portal and instrumentation performed through the anteromedial portal. The knee was sequentially examined beginning in the suprapatellar pouch, then progressing to the patellofemoral space, the medial gutter and compartment, the notch, and finally the lateral compartment and gutter. The findings were as described above. Abundant reactive synovial tissues anteriorly were debrided using the  full-radius  resector in order to improve visualization. The areas of grade III-IV chondromalacia on the weightbearing portion of the medial femoral condyle were debrided back to stable margins using the full-radius resector. The torn portion of the medial meniscus also was debrided back to stable margins using a combination of the straight and curved baskets, and full-radius resector. Subsequent probing of the remaining rim demonstrated good stability. The ArthroCare wand was inserted and used to obtain hemostasis as well as to lightly "anneal" the margins of the areas of full-thickness articular cartilage loss involving the medial femoral condyle. The instruments were removed from the joint after suctioning the excess fluid.   The portal sites and medial stab incision were closed using 4-0 Prolene interrupted sutures before a sterile bulky dressing was applied to the knee. The patient was then awakened, extubated, and returned to the recovery room in satisfactory condition after tolerating the procedure well.

## 2019-03-02 NOTE — OR Nursing (Signed)
Spoke with Dr Ronelle Nigh regarding patients BP 438 systolic through out the day. No new orders given.

## 2019-03-02 NOTE — Transfer of Care (Signed)
Immediate Anesthesia Transfer of Care Note  Patient: Cesar White  Procedure(s) Performed: KNEE ARTHROSCOPY WITH DEBRIDMENT, PARTIAL MEDIAL MENISCECTOMY AND A SUBCHONDROPLASTY OF THE MEDIAL TIBIAL PLATEAU. (Right Knee)  Patient Location: PACU  Anesthesia Type:General  Level of Consciousness: sedated  Airway & Oxygen Therapy: Patient Spontanous Breathing and Patient connected to face mask oxygen  Post-op Assessment: Report given to RN and Post -op Vital signs reviewed and stable  Post vital signs: Reviewed  Last Vitals:  Vitals Value Taken Time  BP 155/79 03/02/19 1607  Temp 36.6 C 03/02/19 1607  Pulse 67 03/02/19 1607  Resp 14 03/02/19 1607  SpO2 97 % 03/02/19 1607    Last Pain:  Vitals:   03/02/19 1200  TempSrc: Temporal  PainSc: 0-No pain         Complications: No apparent anesthesia complications

## 2019-03-02 NOTE — Anesthesia Preprocedure Evaluation (Addendum)
Anesthesia Evaluation  Patient identified by MRN, date of birth, ID band Patient awake    Reviewed: Allergy & Precautions, H&P , NPO status , Patient's Chart, lab work & pertinent test results, reviewed documented beta blocker date and time   Airway Mallampati: II  TM Distance: >3 FB Neck ROM: full    Dental  (+) Caps   Pulmonary    Pulmonary exam normal        Cardiovascular Normal cardiovascular exam+ dysrhythmias Atrial Fibrillation + Valvular Problems/Murmurs      Neuro/Psych CVA, No Residual Symptoms negative psych ROS   GI/Hepatic Neg liver ROS, GERD  Medicated and Controlled,  Endo/Other  negative endocrine ROS  Renal/GU negative Renal ROS  negative genitourinary   Musculoskeletal  (+) Arthritis ,   Abdominal   Peds negative pediatric ROS (+)  Hematology negative hematology ROS (+)   Anesthesia Other Findings Past Medical History: No date: Arthritis     Comment:  left hip - prior to replacement No date: Atrial fibrillation (HCC) No date: GERD (gastroesophageal reflux disease) No date: Heart murmur     Comment:  heard when in HS. not mentioned since. No date: Hypercholesteremia No date: Motion sickness     Comment:  deep sea fishing 2015: Stroke ([place])     Comment:  No deficitis  Reproductive/Obstetrics                            Anesthesia Physical  Anesthesia Plan  ASA: III  Anesthesia Plan: General   Post-op Pain Management:    Induction: Intravenous  PONV Risk Score and Plan: 2 and Ondansetron, Midazolam, Dexamethasone and Treatment may vary due to age or medical condition  Airway Management Planned: LMA  Additional Equipment:   Intra-op Plan:   Post-operative Plan: Extubation in OR  Informed Consent: I have reviewed the patients History and Physical, chart, labs and discussed the procedure including the risks, benefits and alternatives for the proposed  anesthesia with the patient or authorized representative who has indicated his/her understanding and acceptance.     Dental Advisory Given  Plan Discussed with: CRNA  Anesthesia Plan Comments:        Anesthesia Quick Evaluation

## 2019-03-02 NOTE — Anesthesia Procedure Notes (Signed)
Procedure Name: LMA Insertion Date/Time: 03/02/2019 2:45 PM Performed by: Hedda Slade, CRNA Pre-anesthesia Checklist: Patient identified, Patient being monitored, Timeout performed, Emergency Drugs available and Suction available Patient Re-evaluated:Patient Re-evaluated prior to induction Oxygen Delivery Method: Circle system utilized Preoxygenation: Pre-oxygenation with 100% oxygen Induction Type: IV induction Ventilation: Mask ventilation without difficulty LMA: LMA inserted LMA Size: 4.5 Tube type: Oral Number of attempts: 1 Placement Confirmation: positive ETCO2 and breath sounds checked- equal and bilateral Tube secured with: Tape Dental Injury: Teeth and Oropharynx as per pre-operative assessment

## 2019-03-02 NOTE — Discharge Instructions (Addendum)
° ° ° ° ° ° ° °  Please contact your physician with any problems or Same Day Surgery at [phone], Monday through Friday 6 am to 4 pm, or [place] at [facility] number at [phone]  .AMBULATORY SURGERY  DISCHARGE INSTRUCTIONS   1) The drugs that you were given will stay in your system until tomorrow so for the next 24 hours you should not:  A) Drive an automobile B) Make any legal decisions C) Drink any alcoholic beverage   2) You may resume regular meals tomorrow.  Today it is better to start with liquids and gradually work up to solid foods.  You may eat anything you prefer, but it is better to start with liquids, then soup and crackers, and gradually work up to solid foods.   3) Please notify your doctor immediately if you have any unusual bleeding, trouble breathing, redness and pain at the surgery site, drainage, fever, or pain not relieved by medication.    4) Additional Instructions:        Please contact your physician with any problems or Same Day Surgery at [phone], Monday through Friday 6 am to 4 pm, or [place] at [facility] number at [phone].   Orthopedic discharge instructions: Keep dressing dry and intact.  May shower after dressing changed on post-op day #4 (Monday).  Cover sutures with Band-Aids after drying off. Apply ice frequently to knee. Take pain medication as prescribed or ES Tylenol when needed.  May resume Coumadin tonight. May weight-bear as tolerated - use crutches or walker as needed. Follow-up in 10-14 days or as scheduled.

## 2019-03-02 NOTE — Anesthesia Post-op Follow-up Note (Signed)
Anesthesia QCDR form completed.        

## 2019-03-02 NOTE — H&P (Signed)
Paper H&P to be scanned into permanent record. H&P reviewed and patient re-examined. No changes. 

## 2019-03-03 NOTE — Anesthesia Postprocedure Evaluation (Signed)
Anesthesia Post Note  Patient: Cesar White  Procedure(s) Performed: KNEE ARTHROSCOPY WITH DEBRIDMENT, PARTIAL MEDIAL MENISCECTOMY AND A SUBCHONDROPLASTY OF THE MEDIAL TIBIAL PLATEAU. (Right Knee)  Patient location during evaluation: PACU Anesthesia Type: General Level of consciousness: awake and alert Pain management: pain level controlled Vital Signs Assessment: post-procedure vital signs reviewed and stable Respiratory status: spontaneous breathing, nonlabored ventilation, respiratory function stable and patient connected to nasal cannula oxygen Cardiovascular status: blood pressure returned to baseline and stable Postop Assessment: no apparent nausea or vomiting Anesthetic complications: no     Last Vitals:  Vitals:   03/02/19 1655 03/02/19 1701  BP: (!) 174/75 (!) 170/64  Pulse: 62   Resp: 16   Temp: 36.4 C   SpO2: 100%     Last Pain:  Vitals:   03/02/19 1655  TempSrc: Temporal  PainSc: 0-No pain                 Precious Haws Matei Magnone

## 2019-10-04 ENCOUNTER — Other Ambulatory Visit: Payer: MEDICARE

## 2019-10-06 ENCOUNTER — Other Ambulatory Visit: Payer: MEDICARE

## 2019-10-10 ENCOUNTER — Inpatient Hospital Stay: Admit: 2019-10-10 | Payer: MEDICARE | Admitting: Surgery

## 2019-10-10 SURGERY — ARTHROPLASTY, KNEE, TOTAL
Anesthesia: Choice | Site: Knee | Laterality: Right

## 2020-03-28 ENCOUNTER — Ambulatory Visit: Admit: 2020-03-28 | Payer: MEDICARE | Admitting: Otolaryngology

## 2020-03-28 SURGERY — SEPTOPLASTY, NOSE
Anesthesia: General | Laterality: Bilateral

## 2020-07-05 IMAGING — XA DG KNEE 1-2V*R*
2 series · 2 of 2 positions shown · non-contrast
Comparison: None.

CLINICAL DATA: Subchondroplasty of right knee.

EXAM:
RIGHT KNEE - 1-2 VIEW; DG C-ARM 1-60 MIN
FLUOROSCOPY TIME:  21 seconds.

[Series 6001: p1 · 1 of 1 slices shown]
[im 1/1]
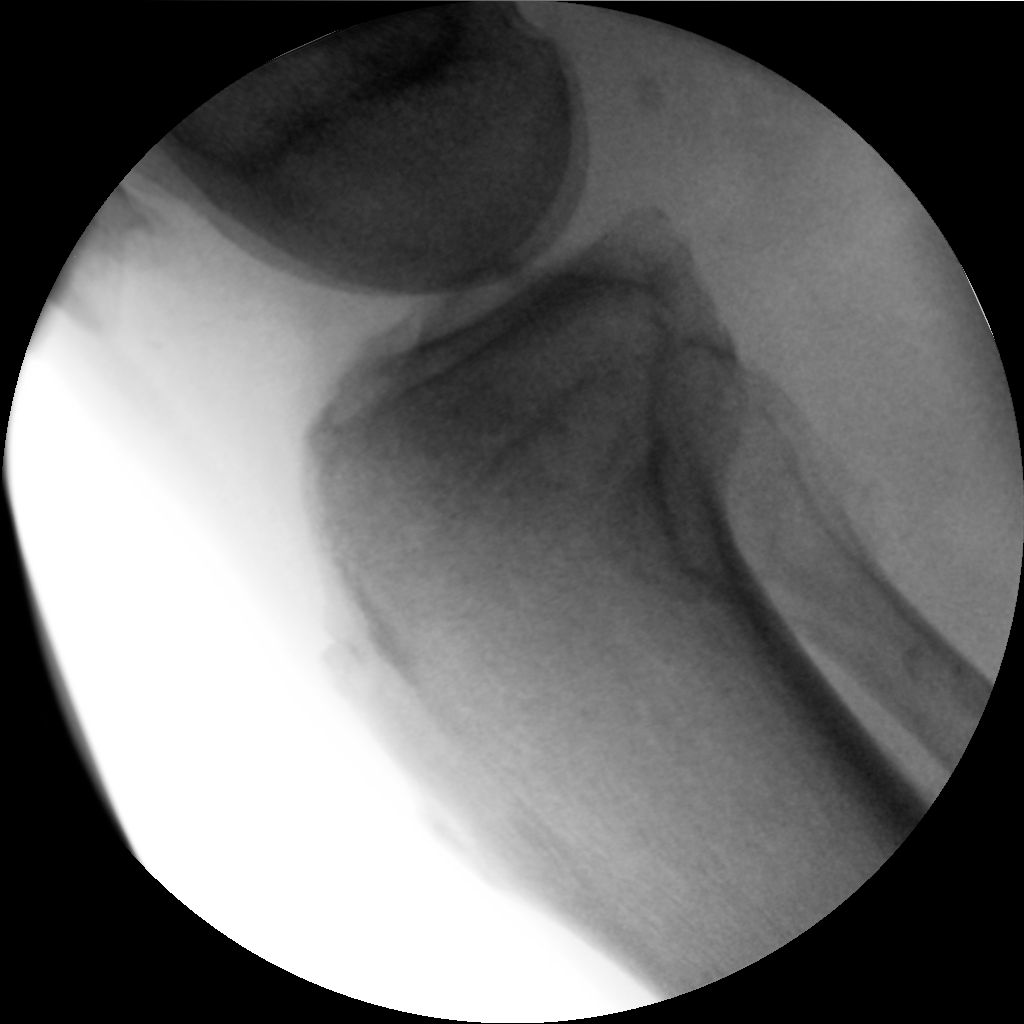

[Series 6002: p2 · 1 of 1 slices shown]
[im 1/1]
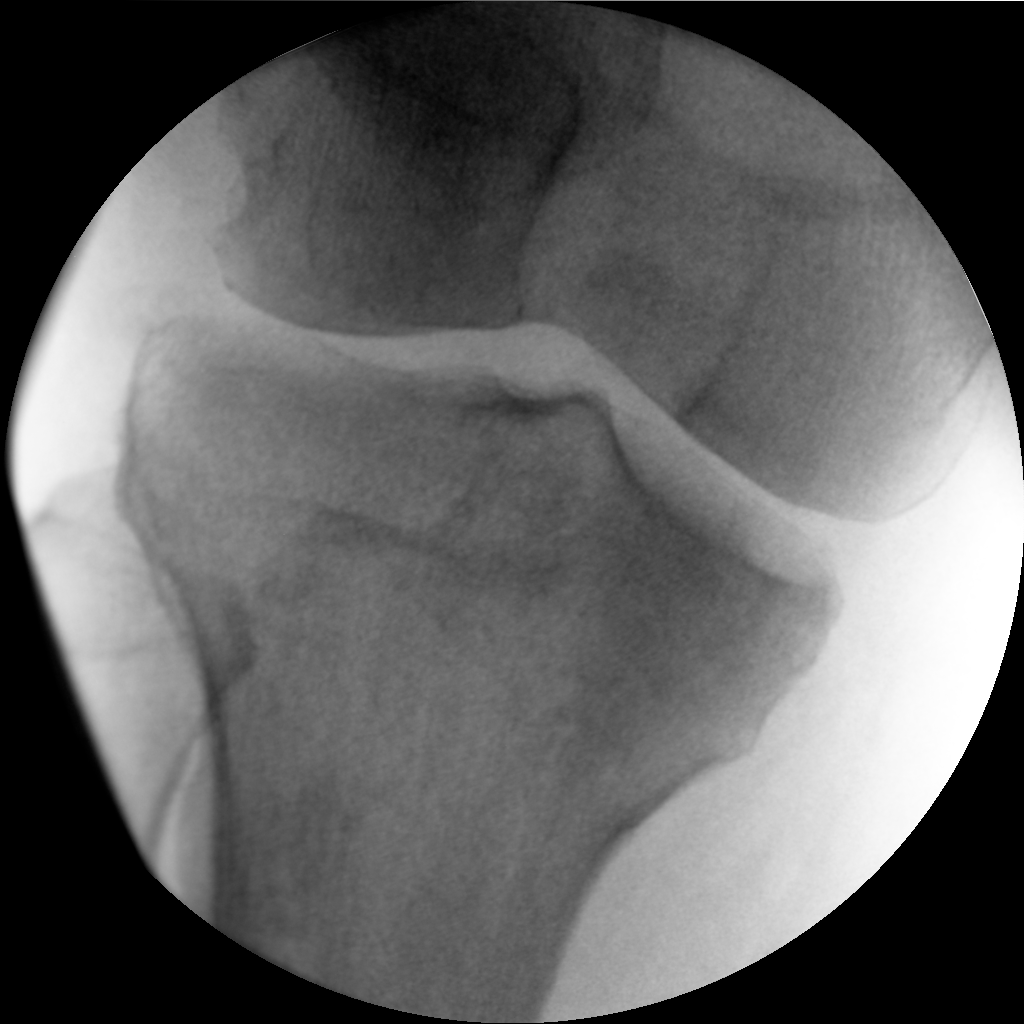

[2 of 2 positions shown; findings below may reference images not displayed]

FINDINGS: Two intraoperative fluoroscopic images were obtained of the right
knee.
IMPRESSION: Fluoroscopic guidance was provided during right knee surgery.

## 2022-01-21 ENCOUNTER — Ambulatory Visit
Admission: EM | Admit: 2022-01-21 | Discharge: 2022-01-21 | Disposition: A | Discharge status: Home / Self Care | Payer: Medicare HMO | Attending: Family Medicine | Admitting: Family Medicine

## 2022-01-21 ENCOUNTER — Encounter: Payer: Self-pay | Admitting: Emergency Medicine

## 2022-01-21 DIAGNOSIS — Z7901 Long term (current) use of anticoagulants: Secondary | ICD-10-CM

## 2022-01-21 DIAGNOSIS — J189 Pneumonia, unspecified organism: Secondary | ICD-10-CM

## 2022-01-21 MED ORDER — AZITHROMYCIN 250 MG PO TABS: 250.0000 mg | ORAL_TABLET | Freq: Every day | ORAL | 0 refills | Status: DC

## 2022-01-21 MED ORDER — ALBUTEROL SULFATE HFA 108 (90 BASE) MCG/ACT IN AERS: 2.0000 | INHALATION_SPRAY | RESPIRATORY_TRACT | 0 refills | Status: DC | PRN

## 2022-01-21 NOTE — ED Provider Notes (Signed)
[facility]    CSN: [id number] Arrival date & time: 01/21/22  4097      History   Chief Complaint Chief Complaint  Patient presents with   Cough    HPI Cesar White is a 88 y.o. male.   HPI   Cesar White presents with son for persistent cough for the past 5 days.  He has Delsym and Mucinex without relief.  No one else around him has similar symptoms.  Denies fever, sore throat, runny nose, nasal congestion, vomiting, nausea, diarrhea, belly pain, shortness of breath and chest discomfort.  There is been no rashes.  He is eating and drinking as well as he normally does.  He does note that he feels a little more fatigued.       Past Medical History:  Diagnosis Date   Arthritis    left hip - prior to replacement   Atrial fibrillation (HCC)    GERD (gastroesophageal reflux disease)    Heart murmur    heard when in HS. not mentioned since.   Hypercholesteremia    Motion sickness    deep sea fishing   Stroke ([place]) 2015   No deficitis    There are no problems to display for this patient.   Past Surgical History:  Procedure Laterality Date   CATARACT EXTRACTION W/PHACO Left 10/14/2015   Procedure: CATARACT EXTRACTION PHACO AND INTRAOCULAR LENS PLACEMENT ([place]) left eye;  Surgeon: Ronnell Freshwater, MD;  Location: [place];  Service: Ophthalmology;  Laterality: Left;  LEFT   CATARACT EXTRACTION W/PHACO Right 12/02/2015   Procedure: CATARACT EXTRACTION PHACO AND INTRAOCULAR LENS PLACEMENT ([place]);  Surgeon: Ronnell Freshwater, MD;  Location: [place];  Service: Ophthalmology;  Laterality: Right;  RIGHT   COLONOSCOPY     KNEE ARTHROSCOPY WITH SUBCHONDROPLASTY Right 03/02/2019   Procedure: KNEE ARTHROSCOPY WITH DEBRIDMENT, PARTIAL MEDIAL MENISCECTOMY AND A SUBCHONDROPLASTY OF THE MEDIAL TIBIAL PLATEAU.;  Surgeon: Corky Mull, MD;  Location: [facility];  Service: Orthopedics;  Laterality: Right;   LID LESION EXCISION Right    TOTAL  HIP ARTHROPLASTY Left 2012       Home Medications    Prior to Admission medications   Medication Sig Start Date End Date Taking? Authorizing Provider  albuterol (VENTOLIN HFA) 108 (90 Base) MCG/ACT inhaler Inhale 2 puffs into the lungs every 4 (four) hours as needed. 01/21/22  Yes Birney Belshe, DO  azithromycin (ZITHROMAX Z-PAK) 250 MG tablet Take 1 tablet (250 mg total) by mouth daily. Take 2 tablets on day 1. 01/21/22  Yes Zakariya Knickerbocker, DO  amLODipine (NORVASC) 5 MG tablet Take 1 tablet by mouth daily.    [provider]  atorvastatin (LIPITOR) 10 MG tablet Take 10 mg by mouth at bedtime.     [provider]  B Complex-C (SUPER B COMPLEX PO) Take 1 tablet by mouth daily.     [provider]  famotidine (PEPCID) 20 MG tablet Take 20 mg by mouth every evening.     [provider]  finasteride (PROSCAR) 5 MG tablet Take 5 mg by mouth every evening.     [provider]  fluticasone (FLONASE) 50 MCG/ACT nasal spray Place 1 spray into both nostrils daily.     [provider]  HYDROcodone-acetaminophen (NORCO/VICODIN) 5-325 MG tablet Take 1 tablet by mouth every 6 (six) hours as needed for moderate pain. 03/02/19   Poggi, Marshall Cork, MD  Magnesium 400 MG TABS Take 400 mg by mouth daily.  [provider]  montelukast (SINGULAIR) 10 MG tablet Take 10 mg by mouth every evening.    [provider]  Omega-3 Fatty Acids (FISH OIL) 1000 MG CAPS Take 1,000 mg by mouth daily.     [provider]  Potassium 99 MG TABS Take 99 mg by mouth daily.    [provider]  tamsulosin (FLOMAX) 0.4 MG CAPS capsule Take 0.4 mg by mouth daily after supper.    [provider]  traMADol (ULTRAM) 50 MG tablet Take 50 mg by mouth every 8 (eight) hours as needed.    [provider]  Vitamin D3 (VITAMIN D) 25 MCG tablet Take 1,000 Units by mouth daily.     [provider]  warfarin (COUMADIN) 10 MG tablet  Take 10 mg by mouth daily.    [provider]    Family History Family History  Problem Relation Age of Onset   Cancer Mother    Heart failure Father     Social History Social History   Tobacco Use   Smoking status: Never   Smokeless tobacco: Never  Vaping Use   Vaping Use: Never used  Substance Use Topics   Alcohol use: Yes    Comment: rare - 1 drink every few months   Drug use: No     Allergies   Patient has no known allergies.   Review of Systems Review of Systems: negative unless otherwise stated in HPI.      Physical Exam Triage Vital Signs ED Triage Vitals  Enc Vitals Group     BP 01/21/22 1036 (!) 164/67     Pulse Rate 01/21/22 1036 73     Resp 01/21/22 1036 16     Temp 01/21/22 1036 98.3 F (36.8 C)     Temp Source 01/21/22 1036 Oral     SpO2 01/21/22 1036 96 %     Weight --      Height --      Head Circumference --      Peak Flow --      Pain Score 01/21/22 1033 0     Pain Loc --      Pain Edu? --      Excl. in GC? --    No data found.  Updated Vital Signs BP (!) 164/67 (BP Location: Left Arm)   Pulse 73   Temp 98.3 F (36.8 C) (Oral)   Resp 16   SpO2 96%   Visual Acuity Right Eye Distance:   Left Eye Distance:   Bilateral Distance:    Right Eye Near:   Left Eye Near:    Bilateral Near:     Physical Exam GEN:     alert, non-toxic appearing male in no distress     HENT:  mucus membranes moist, no nasal discharge EYES:   pupils equal and reactive, no scleral injection NECK:  normal ROM  RESP:  no increased work of breathing, no retractions, right mid lung with rales and expiratory wheezing, no rhonchi  CVS:   regular rate  and rhythm Skin:   warm and dry, no rash on visible skin , normal  skin turgor    UC Treatments / Results  Labs (all labs ordered are listed, but only abnormal results are displayed) Labs Reviewed - No data to display  EKG   Radiology No results found.  Procedures Procedures (including  critical care time)  Medications Ordered in UC Medications - No data to display  Initial Impression /  Assessment and Plan / UC Course  I have reviewed the triage vital signs and the nursing notes.  Pertinent labs & imaging results that were available during my care of the patient were reviewed by me and considered in my medical decision making (see chart for details).       Pt is a 88 y.o. male who presents for 5 days of cough. Cesar White is  afebrile here without recent antipyretics. Satting well on room air. Overall pt is  well appearing, well hydrated, without respiratory distress. Pulmonary exam  is remarkable for rales and wheezing mostly in the mid-right lung. Treat for community acquired pneumonia with azithromycin. Takes warfarin for AFIB.  Spoke with pharmacist at about pt's warafin and azithromycin. Will treat 500 mg daily for 3 days and have patient take Warfarin every other day while taking this treatment. Pt goes in PCPs office for annual visit tomorrow. Asked patient to get INR checked this week.   Return and ED precautions given and patient/son voiced understanding. Discussed MDM, treatment plan and plan for follow-up with patient who agrees with plan.     Final Clinical Impressions(s) / UC Diagnoses   Final diagnoses:  Community acquired pneumonia of right middle lobe of lung  Long term current use of anticoagulant therapy     Discharge Instructions      Stop by the pharmacy to pick up your antibiotics and inhaler.  If you have any shortness of breath, chest tightness, chest discomfort or worsening cough please go to the emergency department or come back to the urgent care.      ED Prescriptions     Medication Sig Dispense Auth. Provider   albuterol (VENTOLIN HFA) 108 (90 Base) MCG/ACT inhaler Inhale 2 puffs into the lungs every 4 (four) hours as needed. 6.7 g Kesa Birky, DO   azithromycin (ZITHROMAX Z-PAK) 250 MG tablet Take 1 tablet (250 mg total) by mouth  daily. Take 2 tablets on day 1. 6 tablet Dejanee Thibeaux, DO      PDMP not reviewed this encounter.   Katha Cabal, DO 01/21/22 1437

## 2022-01-21 NOTE — ED Triage Notes (Signed)
Pt presents with a cough x 4-5 days. Pt denies any other symptoms. He has tried OTC cough medication for symptoms.

## 2022-01-21 NOTE — Discharge Instructions (Addendum)
Stop by the pharmacy to pick up your antibiotics and inhaler.  If you have any shortness of breath, chest tightness, chest discomfort or worsening cough please go to the emergency department or come back to the urgent care.

## 2022-06-01 ENCOUNTER — Encounter: Payer: Self-pay | Admitting: Otolaryngology

## 2022-06-03 ENCOUNTER — Other Ambulatory Visit
Admission: RE | Admit: 2022-06-03 | Discharge: 2022-06-03 | Disposition: A | Discharge status: Home / Self Care | Payer: Medicare HMO | Attending: Urgent Care | Admitting: Urgent Care

## 2022-06-03 DIAGNOSIS — Z79899 Other long term (current) drug therapy: Secondary | ICD-10-CM | POA: Diagnosis not present

## 2022-06-03 DIAGNOSIS — Z01812 Encounter for preprocedural laboratory examination: Secondary | ICD-10-CM | POA: Insufficient documentation

## 2022-06-03 LAB — POTASSIUM: Potassium: 4.3 mmol/L (ref 3.5–5.1)

## 2022-06-03 NOTE — Discharge Instructions (Signed)
[facility] ENDOSCOPIC SINUS SURGERY [facility]  What is Functional Endoscopic Sinus Surgery?  The Surgery involves making the natural openings of the sinuses larger by removing the bony partitions that separate the sinuses from the nasal cavity.  The natural sinus lining is preserved as much as possible to allow the sinuses to resume normal function after the surgery.  In some patients nasal polyps (excessively swollen lining of the sinuses) may be removed to relieve obstruction of the sinus openings.  The surgery is performed through the nose using lighted scopes, which eliminates the need for incisions on the face.  A septoplasty is a different procedure which is sometimes performed with sinus surgery.  It involves straightening the boy partition that separates the two sides of your nose.  A crooked or deviated septum may need repair if is obstructing the sinuses or nasal airflow.  Turbinate reduction is also often performed during sinus surgery.  The turbinates are bony proturberances from the side walls of the nose which swell and can obstruct the nose in patients with sinus and allergy problems.  Their size can be surgically reduced to help relieve nasal obstruction.  What Can Sinus Surgery Do For Me?  Sinus surgery can reduce the frequency of sinus infections requiring antibiotic treatment.  This can provide improvement in nasal congestion, post-nasal drainage, facial pressure and nasal obstruction.  Surgery will NOT prevent you from ever having an infection again, so it usually only for patients who get infections 4 or more times yearly requiring antibiotics, or for infections that do not clear with antibiotics.  It will not cure nasal allergies, so patients with allergies may still require medication to treat their allergies after surgery. Surgery may improve headaches related to sinusitis, however, some people will continue to  require medication to control sinus headaches related to allergies.  Surgery will do nothing for other forms of headache (migraine, tension or cluster).  What Are the Risks of Endoscopic Sinus Surgery?  Current techniques allow surgery to be performed safely with little risk, however, there are rare complications that patients should be aware of.  Because the sinuses are located around the eyes, there is risk of eye injury, including blindness, though again, this would be quite rare. This is usually a result of bleeding behind the eye during surgery, which can effect vision, though there are treatments to protect the vision and prevent permanent injury. More serious complications would include bleeding inside the brain cavity or damage to the brain.This happens when the fluid around the brain leaks out into the sinus cavity.  Again, all of these complications are uncommon, and spinal fluid leaks can be safely managed surgically if they occur.  The most common complication of sinus surgery is bleeding from the nose, which may require packing or cauterization of the nose.  Patients with polyps may experience recurrence of the polyps that would require revision surgery.  Alterations of sense of smell or injury to the tear ducts are also rare complications.   What is the Surgery Like, and what is the Recovery?  The Surgery usually takes a couple of hours to perform, and is usually performed under a general anesthetic (completely asleep).  Patients are usually discharged home after a couple of hours.  Sometimes during surgery it is necessary to pack the nose to control bleeding, and the packing is left in place for 24 - 48 hours, and removed by your surgeon.  If   a septoplasty was performed during the procedure, there is often a splint placed which must be removed after 5-7 days.   Discomfort: Pain is usually mild to moderate, and can be controlled by prescription pain medication or acetaminophen (Tylenol).   Aspirin, Ibuprofen (Advil, Motrin), or Naprosyn (Aleve) should be avoided, as they can cause increased bleeding.  Most patients feel sinus pressure like they have a bad head cold for several days.  Sleeping with your head elevated can help reduce swelling and facial pressure, as can ice packs over the face.  A humidifier may be helpful to keep the mucous and blood from drying in the nose.   Diet: There are no specific diet restrictions, however, you should generally start with clear liquids and a light diet of bland foods because the anesthetic can cause some nausea.  Advance your diet depending on how your stomach feels.  Taking your pain medication with food will often help reduce stomach upset which pain medications can cause.  Nasal Saline Irrigation: It is important to remove blood clots and dried mucous from the nose as it is healing.  This is done by having you irrigate the nose at least 3 - 4 times daily with a salt water solution.  We recommend using NeilMed Sinus Rinse (available at the drug store).  Fill the squeeze bottle with the solution, bend over a sink, and insert the tip of the squeeze bottle into the nose  of an inch.  Point the tip of the squeeze bottle towards the inside corner of the eye on the same side your irrigating.  Squeeze the bottle and gently irrigate the nose.  If you bend forward as you do this, most of the fluid will flow back out of the nose, instead of down your throat.   The solution should be warm, near body temperature, when you irrigate.   Each time you irrigate, you should use a full squeeze bottle.   Note that if you are instructed to use Nasal Steroid Sprays at any time after your surgery, irrigate with saline BEFORE using the steroid spray, so you do not wash it all out of the nose. Another product, Nasal Saline Gel (such as AYR Nasal Saline Gel) can be applied in each nostril 3 - 4 times daily to moisture the nose and reduce scabbing or crusting.  Bleeding:   Bloody drainage from the nose can be expected for several days, and patients are instructed to irrigate their nose frequently with salt water to help remove mucous and blood clots.  The drainage may be dark red or brown, though some fresh blood may be seen intermittently, especially after irrigation.  Do not blow you nose, as bleeding may occur. If you must sneeze, keep your mouth open to allow air to escape through your mouth.  If heavy bleeding occurs: Irrigate the nose with saline to rinse out clots, then spray the nose 3 - 4 times with Afrin Nasal Decongestant Spray.  The spray will constrict the blood vessels to slow bleeding.  Pinch the lower half of your nose shut to apply pressure, and lay down with your head elevated.  Ice packs over the nose may help as well. If bleeding persists despite these measures, you should notify your doctor.  Do not use the Afrin routinely to control nasal congestion after surgery, as it can result in worsening congestion and may affect healing.     Activity: Return to work varies among patients. Most patients will be out   of work at least 5 - 7 days to recover.  Patient may return to work after they are off of narcotic pain medication, and feeling well enough to perform the functions of their job.  Patients must avoid heavy lifting (over 10 pounds) or strenuous physical for 2 weeks after surgery, so your employer may need to assign you to light duty, or keep you out of work longer if light duty is not possible.  NOTE: you should not drive, operate dangerous machinery, do any mentally demanding tasks or make any important legal or financial decisions while on narcotic pain medication and recovering from the general anesthetic.    Call Your Doctor Immediately if You Have Any of the Following: Bleeding that you cannot control with the above measures Loss of vision, double vision, bulging of the eye or black eyes. Fever over 101 degrees Neck stiffness with severe headache,  fever, nausea and change in mental state. You are always encouraged to call anytime with concerns, however, please call with requests for pain medication refills during office hours.  Office Endoscopy: During follow-up visits your doctor will remove any packing or splints that may have been placed and evaluate and clean your sinuses endoscopically.  Topical anesthetic will be used to make this as comfortable as possible, though you may want to take your pain medication prior to the visit.  How often this will need to be done varies from patient to patient.  After complete recovery from the surgery, you may need follow-up endoscopy from time to time, particularly if there is concern of recurrent infection or nasal polyps.  

## 2022-06-11 ENCOUNTER — Encounter
Admission: RE | Disposition: A | Discharge status: Home / Self Care | Payer: Self-pay | Source: Home / Self Care | Attending: Otolaryngology

## 2022-06-11 ENCOUNTER — Encounter: Payer: Self-pay | Admitting: Otolaryngology

## 2022-06-11 ENCOUNTER — Ambulatory Visit: Payer: Medicare HMO | Admitting: Anesthesiology

## 2022-06-11 ENCOUNTER — Ambulatory Visit
Admission: RE | Admit: 2022-06-11 | Discharge: 2022-06-11 | Disposition: A | Discharge status: Home / Self Care | Payer: Medicare HMO | Attending: Otolaryngology | Admitting: Otolaryngology

## 2022-06-11 ENCOUNTER — Other Ambulatory Visit: Payer: Self-pay

## 2022-06-11 DIAGNOSIS — K219 Gastro-esophageal reflux disease without esophagitis: Secondary | ICD-10-CM | POA: Diagnosis not present

## 2022-06-11 DIAGNOSIS — Z7901 Long term (current) use of anticoagulants: Secondary | ICD-10-CM | POA: Insufficient documentation

## 2022-06-11 DIAGNOSIS — J45909 Unspecified asthma, uncomplicated: Secondary | ICD-10-CM | POA: Diagnosis not present

## 2022-06-11 DIAGNOSIS — Z6833 Body mass index (BMI) 33.0-33.9, adult: Secondary | ICD-10-CM | POA: Diagnosis not present

## 2022-06-11 DIAGNOSIS — J343 Hypertrophy of nasal turbinates: Secondary | ICD-10-CM | POA: Diagnosis not present

## 2022-06-11 DIAGNOSIS — Z79899 Other long term (current) drug therapy: Secondary | ICD-10-CM

## 2022-06-11 DIAGNOSIS — M1990 Unspecified osteoarthritis, unspecified site: Secondary | ICD-10-CM | POA: Insufficient documentation

## 2022-06-11 DIAGNOSIS — E669 Obesity, unspecified: Secondary | ICD-10-CM | POA: Insufficient documentation

## 2022-06-11 DIAGNOSIS — I480 Paroxysmal atrial fibrillation: Secondary | ICD-10-CM | POA: Diagnosis not present

## 2022-06-11 DIAGNOSIS — J342 Deviated nasal septum: Secondary | ICD-10-CM | POA: Diagnosis present

## 2022-06-11 DIAGNOSIS — E782 Mixed hyperlipidemia: Secondary | ICD-10-CM | POA: Diagnosis not present

## 2022-06-11 DIAGNOSIS — Z01812 Encounter for preprocedural laboratory examination: Secondary | ICD-10-CM

## 2022-06-11 HISTORY — PX: NASAL TURBINATE REDUCTION: SHX2072

## 2022-06-11 HISTORY — PX: SEPTOPLASTY: SHX2393

## 2022-06-11 HISTORY — DX: Presence of external hearing-aid: Z97.4

## 2022-06-11 SURGERY — SEPTOPLASTY, NOSE
Anesthesia: General | Site: Nose

## 2022-06-11 MED ORDER — HYDROCODONE-ACETAMINOPHEN 5-325 MG PO TABS: 1.0000 | ORAL_TABLET | Freq: Four times a day (QID) | ORAL | 0 refills | Status: AC | PRN

## 2022-06-11 MED ORDER — OXYMETAZOLINE HCL 0.05 % NA SOLN
2.0000 | Freq: Once | NASAL | Status: AC
  Administered 2022-06-11: 2 via NASAL

## 2022-06-11 MED ORDER — PHENYLEPHRINE HCL (PRESSORS) 10 MG/ML IV SOLN
INTRAVENOUS | Status: DC | PRN
  Administered 2022-06-11: 80 ug via INTRAVENOUS
  Administered 2022-06-11: 100 ug via INTRAVENOUS

## 2022-06-11 MED ORDER — PROPOFOL 10 MG/ML IV BOLUS
INTRAVENOUS | Status: DC | PRN
  Administered 2022-06-11: 150 mg via INTRAVENOUS

## 2022-06-11 MED ORDER — ACETAMINOPHEN 325 MG PO TABS
650.0000 mg | ORAL_TABLET | Freq: Once | ORAL | Status: AC | PRN
  Administered 2022-06-11: 650 mg via ORAL

## 2022-06-11 MED ORDER — SUCCINYLCHOLINE CHLORIDE 200 MG/10ML IV SOSY
PREFILLED_SYRINGE | INTRAVENOUS | Status: DC | PRN
  Administered 2022-06-11: 100 mg via INTRAVENOUS

## 2022-06-11 MED ORDER — PHENYLEPHRINE HCL 0.5 % NA SOLN
NASAL | Status: DC | PRN
  Administered 2022-06-11: 15 mL via TOPICAL

## 2022-06-11 MED ORDER — ONDANSETRON HCL 4 MG/2ML IJ SOLN: 4.0000 mg | Freq: Once | INTRAMUSCULAR | Status: DC | PRN

## 2022-06-11 MED ORDER — EPHEDRINE SULFATE (PRESSORS) 50 MG/ML IJ SOLN
INTRAMUSCULAR | Status: DC | PRN
  Administered 2022-06-11: 7.5 mg via INTRAVENOUS
  Administered 2022-06-11: 5 mg via INTRAVENOUS

## 2022-06-11 MED ORDER — LIDOCAINE-EPINEPHRINE 1 %-1:100000 IJ SOLN
INTRAMUSCULAR | Status: DC | PRN
  Administered 2022-06-11: 6 mL

## 2022-06-11 MED ORDER — DEXTROSE 5 % IV SOLN
2000.0000 mg | Freq: Once | INTRAVENOUS | Status: AC
  Administered 2022-06-11: 2000 mg via INTRAVENOUS

## 2022-06-11 MED ORDER — SUGAMMADEX SODIUM 200 MG/2ML IV SOLN
INTRAVENOUS | Status: DC | PRN
  Administered 2022-06-11: 225.8 mg via INTRAVENOUS

## 2022-06-11 MED ORDER — PREDNISONE 10 MG PO TABS: ORAL_TABLET | ORAL | 0 refills | Status: DC

## 2022-06-11 MED ORDER — FENTANYL CITRATE (PF) 100 MCG/2ML IJ SOLN
INTRAMUSCULAR | Status: DC | PRN
  Administered 2022-06-11 (×2): 50 ug via INTRAVENOUS

## 2022-06-11 MED ORDER — LACTATED RINGERS IV SOLN: INTRAVENOUS | Status: DC

## 2022-06-11 MED ORDER — CEPHALEXIN 500 MG PO CAPS: 500.0000 mg | ORAL_CAPSULE | Freq: Two times a day (BID) | ORAL | 0 refills | Status: DC

## 2022-06-11 MED ORDER — ROCURONIUM BROMIDE 100 MG/10ML IV SOLN
INTRAVENOUS | Status: DC | PRN
  Administered 2022-06-11: 20 mg via INTRAVENOUS

## 2022-06-11 MED ORDER — DEXAMETHASONE SODIUM PHOSPHATE 4 MG/ML IJ SOLN
INTRAMUSCULAR | Status: DC | PRN
  Administered 2022-06-11: 10 mg via INTRAVENOUS

## 2022-06-11 MED ORDER — ACETAMINOPHEN 160 MG/5ML PO SOLN: 325.0000 mg | ORAL | Status: AC | PRN

## 2022-06-11 MED ORDER — ONDANSETRON HCL 4 MG/2ML IJ SOLN
INTRAMUSCULAR | Status: DC | PRN
  Administered 2022-06-11: 4 mg via INTRAVENOUS

## 2022-06-11 SURGICAL SUPPLY — 25 items
CANISTER SUCT 1200ML W/VALVE (MISCELLANEOUS) ×2 IMPLANT
CATH IV 18X1 1/4 SAFELET (CATHETERS) IMPLANT
COAGULATOR SUCT 8FR VV (MISCELLANEOUS) ×2 IMPLANT
ELECT REM PT RETURN 9FT ADLT (ELECTROSURGICAL) ×2
ELECTRODE REM PT RTRN 9FT ADLT (ELECTROSURGICAL) ×2 IMPLANT
GLOVE SURG GAMMEX PI TX LF 7.5 (GLOVE) ×4 IMPLANT
GOWN STRL REUS W/ TWL LRG LVL3 (GOWN DISPOSABLE) ×2 IMPLANT
GOWN STRL REUS W/TWL LRG LVL3 (GOWN DISPOSABLE) ×2
IV CATH 18X1 1/4 SAFELET (CATHETERS) ×2
KIT TURNOVER KIT A (KITS) ×2 IMPLANT
NDL HYPO 27GX1-1/4 (NEEDLE) ×2 IMPLANT
NEEDLE HYPO 27GX1-1/4 (NEEDLE) ×2 IMPLANT
NS IRRIG 500ML POUR BTL (IV SOLUTION) ×2 IMPLANT
PACK ENT CUSTOM (PACKS) ×2 IMPLANT
PATTIES SURGICAL .5 X3 (DISPOSABLE) ×2 IMPLANT
SPLINT NASAL SEPTAL BLV .50 ST (MISCELLANEOUS) ×2 IMPLANT
STRAP BODY AND KNEE 60X3 (MISCELLANEOUS) ×2 IMPLANT
SUT CHROMIC 3-0 (SUTURE) ×2
SUT CHROMIC 3-0 KS 27XMFL CR (SUTURE) ×2
SUT ETHILON 3-0 KS 30 BLK (SUTURE) ×2 IMPLANT
SUT PLAIN GUT 4-0 (SUTURE) ×2 IMPLANT
SUTURE CHRMC 3-0 KS 27XMFL CR (SUTURE) IMPLANT
SYR 3ML LL SCALE MARK (SYRINGE) ×2 IMPLANT
TOWEL OR 17X26 4PK STRL BLUE (TOWEL DISPOSABLE) ×2 IMPLANT
WATER STERILE IRR 250ML POUR (IV SOLUTION) ×2 IMPLANT

## 2022-06-11 NOTE — H&P (Signed)
H&P has been reviewed and patient reevaluated, no changes necessary. To be downloaded later.  

## 2022-06-11 NOTE — Transfer of Care (Signed)
Immediate Anesthesia Transfer of Care Note  Patient: Cesar White  Procedure(s) Performed: SEPTOPLASTY (Nose) INFERIOR TURBINATE REDUCTION (Bilateral: Nose)  Patient Location: PACU  Anesthesia Type: General  Level of Consciousness: awake, alert  and patient cooperative  Airway and Oxygen Therapy: Patient Spontanous Breathing and Patient connected to supplemental oxygen  Post-op Assessment: Post-op Vital signs reviewed, Patient's Cardiovascular Status Stable, Respiratory Function Stable, Patent Airway and No signs of Nausea or vomiting  Post-op Vital Signs: Reviewed and stable  Complications: No notable events documented.

## 2022-06-11 NOTE — Op Note (Signed)
06/11/2022  9:34 AM  [id number]   Pre-Op Dx:  Deviated Nasal Septum, Hypertrophic Inferior Turbinates  Post-op Dx: Same  Proc: Nasal Septoplasty, Bilateral Partial Reduction Inferior Turbinates   Surg:  Elon Alas Demiya Magno  Anes:  GOT  EBL: 50 mL  Comp: None  Findings: The septum was deviated to the right side with very large right posterior spur.  In the anterior inferior quadrangular plate it was overhanging on the left side.  Procedure: With the patient in a comfortable supine position,  general orotracheal anesthesia was induced without difficulty.     The patient received preoperative Afrin spray for topical decongestion and vasoconstriction.  Intravenous prophylactic antibiotics were administered.  At an appropriate level, the patient was placed in a semi-sitting position.  Nasal vibrissae were trimmed.   1% Xylocaine with 1:100,000 epinephrine, 6 cc's, was infiltrated into the anterior floor of the nose, into the nasal spine region, into the membranous columella, and finally into the submucoperichondrial plane of the septum on both sides.  Several minutes were allowed for this to take effect.  Cottoniod pledgetts soaked in Afrin and 4% Xylocaine were placed into both nasal cavities and left while the patient was prepped and draped in the standard fashion.  The materials were removed from the nose and observed to be intact and correct in number.  The nose was inspected with a headlight and zero degree scope with the findings as described above.  A left hemitransfixion incision was sharply executed and carried down to the quadrangular cartilage. The mucoperichondrium was elelvated along the quadrangular plate back to the bony-cartilaginous junction. The mucoperiostium was then elevated along the ethmoid plate and the vomer. The boney-catilaginous junction was then split with a freer elevator and the mucoperiosteum was elevated on the opposite side. The mucoperiosteum was then elevated  along the maxillary crest as needed to expose the crooked bone of the crest.  Boney spurs of the vomer and maxillary crest were removed with Donavan Foil forceps.  The cartilaginous plate was trimmed along its posterior and inferior borders of about 2 mm of cartilage to free it up inferiorly. Some of the deviated ethmoid plate was then fractured and removed with Takahashi forceps to free up the posterior border of the quadrangular plate and allow it to swing back to the midline. The mucosal flaps were placed back into their anatomic position to allow visualization of the airways. The septum now sat in the midline with an improved airway.  A 3-0 Chromic suture on a Keith needle in used to anchor the inferior septum at the nasal spine with a through and through suture. The mucosal flaps are then sutured together using a through and through whip stitch of 4-0 Plain Gut with a mini-Keith needle. This was used to close the hemitransfixion incision as well.   The inferior turbinates were then inspected. An incision was created along the inferior aspect of the left inferior turbinate with removal of some of the inferior soft tissue and bone. Electrocautery was used to control bleeding in the area. The remaining turbinate was then outfractured to open up the airway further. There was no significant bleeding noted. The right turbinate was then trimmed and outfractured in a similar fashion.  The airways were then visualized and showed open passageways on both sides that were significantly improved compared to before surgery. There was no signifcant bleeding. Nasal splints were applied to both sides of the septum using Xomed 0.32mm regular sized splints that were trimmed, and then held  in position with a 3-0 Nylon through and through suture.  The patient was turned back over to anesthesia, and awakened, extubated, and taken to the PACU in satisfactory condition.  Dispo:   PACU to home  Plan: Ice, elevation, narcotic  analgesia, steroid taper, and prophylactic antibiotics for the duration of indwelling nasal foreign bodies.  We will reevaluate the patient in the office in 6 days and remove the septal splints.  Return to work in 10 days, strenuous activities in two weeks.   Elon Alas Keana Dueitt 06/11/2022 9:34 AM

## 2022-06-11 NOTE — Anesthesia Preprocedure Evaluation (Signed)
Anesthesia Evaluation  Patient identified by MRN, date of birth, ID band Patient awake    Reviewed: Allergy & Precautions, NPO status , Patient's Chart, lab work & pertinent test results  History of Anesthesia Complications Negative for: history of anesthetic complications  Airway Mallampati: IV       Dental  (+) Missing Bridges :   Pulmonary neg pulmonary ROS   Pulmonary exam normal breath sounds clear to auscultation       Cardiovascular + dysrhythmias (a fib on warfarin)  Rhythm:Irregular Rate:Normal  Myocardial perfusion 05/26/21:   Borderline inferior defect no clear evidence of redistribution  there is left ventricular enlargement overall ejection fraction of 63%.   No clear evidence of ischemia recommend conservative medical therapy   Echo 05/26/21:  NORMAL LEFT VENTRICULAR SYSTOLIC FUNCTION  NORMAL RIGHT VENTRICULAR SYSTOLIC FUNCTION  NO VALVULAR STENOSIS  MODERATE MR, TR  TRIVIAL PR  MILD AR  EF 50-55%     Neuro/Psych HOH CVA (2015, no residual symptoms)    GI/Hepatic ,GERD  ,,  Endo/Other  Obesity   Renal/GU negative Renal ROS     Musculoskeletal  (+) Arthritis ,    Abdominal   Peds  Hematology negative hematology ROS (+)   Anesthesia Other Findings Cardiology note 06/03/22:  89 y.o. male with  1. Atrial fibrillation, unspecified type (CMS-HCC)  2. Bradycardia  3. Edema of both legs  4. Hypertension, unspecified type  5. H/O: CVA (cerebrovascular accident)  6. Mixed hyperlipidemia  7. Gastroesophageal reflux disease, unspecified whether esophagitis present  8. Mild asthma, unspecified whether complicated, unspecified whether persistent  9. Obesity (BMI 30.0-34.9), unspecified   Plan   Paroxysmal a-fib, continue Coumadin for anticoagulation. I disagree with discontinuing Coumadin due to patient's high risk of stroke from atrial fibrillation.  Bradycardia, heart rate today is 87, stable,  recommend continued medical therapy no clinic's for the pacemaker Edema in bilateral extremities, continue HCTZ, recommend BLE elevation, support stockings, and decreased sodium intake.  Hypertension, well controlled, BP today is 124/72, continue amlodipine and lisinopril H/o CVA no residual deficits continue Coumadin for presumed CVA from A-fib.  Hyperlipidemia, currently on Lipitor therapy for lipid management. GERD, continue famotidine consider adding omeprazole or Protonix as needed, management per PCP Asthma, stable, recommend inhalers as needed, consider follow-up with pulmonary Obesity recommend exercise, weight loss, and portion control Have the patient follow-up in 6 months   Return in about 1 year (around 06/03/2023).    Reproductive/Obstetrics                              Anesthesia Physical Anesthesia Plan  ASA: 3  Anesthesia Plan: General   Post-op Pain Management:    Induction: Intravenous  PONV Risk Score and Plan: 2 and Ondansetron, Dexamethasone and Treatment may vary due to age or medical condition  Airway Management Planned: Oral ETT  Additional Equipment:   Intra-op Plan:   Post-operative Plan: Extubation in OR  Informed Consent: I have reviewed the patients History and Physical, chart, labs and discussed the procedure including the risks, benefits and alternatives for the proposed anesthesia with the patient or authorized representative who has indicated his/her understanding and acceptance.     Dental advisory given  Plan Discussed with: CRNA  Anesthesia Plan Comments: (Patient consented for risks of anesthesia including but not limited to:  - adverse reactions to medications - damage to eyes, teeth, lips or other oral mucosa - nerve damage due to  positioning  - sore throat or hoarseness - damage to heart, brain, nerves, lungs, other parts of body or loss of life  Informed patient about role of CRNA in peri- and  intra-operative care.  Patient voiced understanding.)         Anesthesia Quick Evaluation

## 2022-06-11 NOTE — Anesthesia Postprocedure Evaluation (Signed)
Anesthesia Post Note  Patient: Cesar White  Procedure(s) Performed: SEPTOPLASTY (Nose) INFERIOR TURBINATE REDUCTION (Bilateral: Nose)  Patient location during evaluation: PACU Anesthesia Type: General Level of consciousness: awake and alert, oriented and patient cooperative Pain management: pain level controlled Vital Signs Assessment: post-procedure vital signs reviewed and stable Respiratory status: spontaneous breathing, nonlabored ventilation and respiratory function stable Cardiovascular status: blood pressure returned to baseline and stable Postop Assessment: adequate PO intake Anesthetic complications: no   No notable events documented.   Last Vitals:  Vitals:   06/11/22 1005 06/11/22 1010  BP:    Pulse: (!) 56 (!) 51  Resp: 17 17  Temp:    SpO2: 97% 92%    Last Pain:  Vitals:   06/11/22 0955  PainSc: 5                  Darrin Nipper

## 2022-06-12 ENCOUNTER — Encounter: Payer: Self-pay | Admitting: Otolaryngology

## 2023-01-04 ENCOUNTER — Other Ambulatory Visit: Payer: Self-pay | Admitting: Internal Medicine

## 2023-01-04 DIAGNOSIS — R079 Chest pain, unspecified: Secondary | ICD-10-CM

## 2023-01-13 ENCOUNTER — Ambulatory Visit
Admission: RE | Admit: 2023-01-13 | Discharge: 2023-01-13 | Disposition: A | Discharge status: Home / Self Care | Payer: Medicare HMO | Source: Ambulatory Visit | Attending: Internal Medicine | Admitting: Internal Medicine

## 2023-01-13 DIAGNOSIS — R079 Chest pain, unspecified: Secondary | ICD-10-CM | POA: Insufficient documentation

## 2023-02-24 ENCOUNTER — Encounter: Payer: Self-pay | Admitting: Internal Medicine

## 2023-02-24 ENCOUNTER — Encounter
Admission: RE | Disposition: A | Discharge status: Home / Self Care | Payer: Self-pay | Source: Home / Self Care | Attending: Internal Medicine

## 2023-02-24 ENCOUNTER — Observation Stay
Admission: RE | Admit: 2023-02-24 | Discharge: 2023-02-24 | Disposition: A | Discharge status: Home / Self Care | Payer: Medicare HMO | Attending: Internal Medicine | Admitting: Internal Medicine

## 2023-02-24 DIAGNOSIS — Z7901 Long term (current) use of anticoagulants: Secondary | ICD-10-CM | POA: Insufficient documentation

## 2023-02-24 DIAGNOSIS — I2510 Atherosclerotic heart disease of native coronary artery without angina pectoris: Secondary | ICD-10-CM | POA: Diagnosis present

## 2023-02-24 DIAGNOSIS — Z96642 Presence of left artificial hip joint: Secondary | ICD-10-CM | POA: Insufficient documentation

## 2023-02-24 DIAGNOSIS — Z79899 Other long term (current) drug therapy: Secondary | ICD-10-CM | POA: Insufficient documentation

## 2023-02-24 DIAGNOSIS — I480 Paroxysmal atrial fibrillation: Secondary | ICD-10-CM | POA: Insufficient documentation

## 2023-02-24 DIAGNOSIS — I25119 Atherosclerotic heart disease of native coronary artery with unspecified angina pectoris: Principal | ICD-10-CM | POA: Diagnosis present

## 2023-02-24 DIAGNOSIS — Z8673 Personal history of transient ischemic attack (TIA), and cerebral infarction without residual deficits: Secondary | ICD-10-CM | POA: Insufficient documentation

## 2023-02-24 DIAGNOSIS — R9430 Abnormal result of cardiovascular function study, unspecified: Secondary | ICD-10-CM

## 2023-02-24 DIAGNOSIS — J45909 Unspecified asthma, uncomplicated: Secondary | ICD-10-CM | POA: Insufficient documentation

## 2023-02-24 HISTORY — PX: LEFT HEART CATH AND CORONARY ANGIOGRAPHY: CATH118249

## 2023-02-24 LAB — CBC
HCT: 23.9 % — ABNORMAL LOW (ref 39.0–52.0)
Hemoglobin: 7.8 g/dL — ABNORMAL LOW (ref 13.0–17.0)
MCH: 29.5 pg (ref 26.0–34.0)
MCHC: 32.6 g/dL (ref 30.0–36.0)
MCV: 90.5 fL (ref 80.0–100.0)
Platelets: 244 10*3/uL (ref 150–400)
RBC: 2.64 MIL/uL — ABNORMAL LOW (ref 4.22–5.81)
RDW: 15.6 % — ABNORMAL HIGH (ref 11.5–15.5)
WBC: 8.3 10*3/uL (ref 4.0–10.5)
nRBC: 0 % (ref 0.0–0.2)

## 2023-02-24 LAB — CARDIAC CATHETERIZATION: Cath EF Quantitative: 45 %

## 2023-02-24 LAB — APTT: aPTT: 61 s — ABNORMAL HIGH (ref 24–36)

## 2023-02-24 LAB — HEPARIN LEVEL (UNFRACTIONATED): Heparin Unfractionated: 0.31 [IU]/mL (ref 0.30–0.70)

## 2023-02-24 SURGERY — LEFT HEART CATH AND CORONARY ANGIOGRAPHY
Anesthesia: Moderate Sedation | Laterality: Left

## 2023-02-24 MED ORDER — TAMSULOSIN HCL 0.4 MG PO CAPS
0.8000 mg | ORAL_CAPSULE | Freq: Every day | ORAL | Status: DC
  Administered 2023-02-24: 0.8 mg via ORAL
  Filled 2023-02-24: qty 2

## 2023-02-24 MED ORDER — ATORVASTATIN CALCIUM 80 MG PO TABS: 80.0000 mg | ORAL_TABLET | Freq: Every day | ORAL | Status: DC

## 2023-02-24 MED ORDER — FENTANYL CITRATE (PF) 100 MCG/2ML IJ SOLN
INTRAMUSCULAR | Status: AC
  Filled 2023-02-24: qty 2

## 2023-02-24 MED ORDER — FERROUS SULFATE 325 (65 FE) MG PO TABS: 325.0000 mg | ORAL_TABLET | Freq: Every day | ORAL | Status: DC

## 2023-02-24 MED ORDER — SODIUM CHLORIDE 0.9 % WEIGHT BASED INFUSION
3.0000 mL/kg/h | INTRAVENOUS | Status: AC
  Administered 2023-02-24: 3 mL/kg/h via INTRAVENOUS

## 2023-02-24 MED ORDER — HEPARIN SODIUM (PORCINE) 1000 UNIT/ML IJ SOLN
INTRAMUSCULAR | Status: DC | PRN
  Administered 2023-02-24: 5000 [IU] via INTRAVENOUS

## 2023-02-24 MED ORDER — VERAPAMIL HCL 2.5 MG/ML IV SOLN
INTRAVENOUS | Status: DC | PRN
  Administered 2023-02-24: 2.5 mg via INTRAVENOUS

## 2023-02-24 MED ORDER — HEPARIN BOLUS VIA INFUSION
2000.0000 [IU] | Freq: Once | INTRAVENOUS | Status: AC
  Administered 2023-02-24: 2000 [IU] via INTRAVENOUS
  Filled 2023-02-24: qty 2000

## 2023-02-24 MED ORDER — ISOSORBIDE MONONITRATE ER 60 MG PO TB24: 60.0000 mg | ORAL_TABLET | Freq: Every morning | ORAL | Status: DC

## 2023-02-24 MED ORDER — SODIUM CHLORIDE 0.9 % WEIGHT BASED INFUSION
20.0000 mL/h | INTRAVENOUS | Status: DC
  Administered 2023-02-24: 20 mL/h via INTRAVENOUS

## 2023-02-24 MED ORDER — HYDROCHLOROTHIAZIDE 12.5 MG PO TABS: 12.5000 mg | ORAL_TABLET | Freq: Every day | ORAL | Status: DC

## 2023-02-24 MED ORDER — HEPARIN (PORCINE) IN NACL 2000-0.9 UNIT/L-% IV SOLN
INTRAVENOUS | Status: DC | PRN
  Administered 2023-02-24: 1000 mL

## 2023-02-24 MED ORDER — FAMOTIDINE 20 MG PO TABS: 20.0000 mg | ORAL_TABLET | Freq: Two times a day (BID) | ORAL | Status: DC

## 2023-02-24 MED ORDER — SODIUM CHLORIDE 0.9 % IV SOLN: 250.0000 mL | INTRAVENOUS | Status: DC | PRN

## 2023-02-24 MED ORDER — VERAPAMIL HCL 2.5 MG/ML IV SOLN
INTRAVENOUS | Status: AC
  Filled 2023-02-24: qty 2

## 2023-02-24 MED ORDER — SODIUM CHLORIDE 0.9% FLUSH: 3.0000 mL | Freq: Two times a day (BID) | INTRAVENOUS | Status: DC

## 2023-02-24 MED ORDER — ASPIRIN 81 MG PO TBEC: 81.0000 mg | DELAYED_RELEASE_TABLET | Freq: Every day | ORAL | Status: DC

## 2023-02-24 MED ORDER — HEPARIN (PORCINE) 25000 UT/250ML-% IV SOLN
INTRAVENOUS | Status: AC
  Filled 2023-02-24: qty 250

## 2023-02-24 MED ORDER — HEPARIN SODIUM (PORCINE) 1000 UNIT/ML IJ SOLN
INTRAMUSCULAR | Status: AC
  Filled 2023-02-24: qty 10

## 2023-02-24 MED ORDER — IOHEXOL 300 MG/ML  SOLN
INTRAMUSCULAR | Status: DC | PRN
  Administered 2023-02-24: 175 mL

## 2023-02-24 MED ORDER — SODIUM CHLORIDE 0.9 % WEIGHT BASED INFUSION: 1.0000 mL/kg/h | INTRAVENOUS | Status: DC

## 2023-02-24 MED ORDER — SODIUM CHLORIDE 0.9% FLUSH: 3.0000 mL | INTRAVENOUS | Status: DC | PRN

## 2023-02-24 MED ORDER — LIDOCAINE HCL (PF) 1 % IJ SOLN
INTRAMUSCULAR | Status: DC | PRN
  Administered 2023-02-24: 2 mL

## 2023-02-24 MED ORDER — HEPARIN (PORCINE) 25000 UT/250ML-% IV SOLN
1350.0000 [IU]/h | INTRAVENOUS | Status: DC
  Administered 2023-02-24: 1350 [IU]/h via INTRAVENOUS

## 2023-02-24 MED ORDER — HEPARIN (PORCINE) 25000 UT/250ML-% IV SOLN: 1350.0000 [IU]/h | INTRAVENOUS | 0 refills | Status: DC

## 2023-02-24 MED ORDER — LISINOPRIL 20 MG PO TABS: 40.0000 mg | ORAL_TABLET | Freq: Every day | ORAL | Status: DC

## 2023-02-24 MED ORDER — FENTANYL CITRATE (PF) 100 MCG/2ML IJ SOLN
INTRAMUSCULAR | Status: DC | PRN
  Administered 2023-02-24: 25 ug via INTRAVENOUS

## 2023-02-24 MED ORDER — HYDRALAZINE HCL 25 MG PO TABS: 25.0000 mg | ORAL_TABLET | Freq: Two times a day (BID) | ORAL | Status: DC

## 2023-02-24 MED ORDER — MIDAZOLAM HCL 2 MG/2ML IJ SOLN
INTRAMUSCULAR | Status: DC | PRN
  Administered 2023-02-24: 1 mg via INTRAVENOUS

## 2023-02-24 MED ORDER — LIDOCAINE HCL 1 % IJ SOLN
INTRAMUSCULAR | Status: AC
  Filled 2023-02-24: qty 20

## 2023-02-24 MED ORDER — ASPIRIN 81 MG PO CHEW
CHEWABLE_TABLET | ORAL | Status: AC
Start: 2023-02-24 — End: ?
  Filled 2023-02-24: qty 1

## 2023-02-24 MED ORDER — ASPIRIN 81 MG PO TBEC: 81.0000 mg | DELAYED_RELEASE_TABLET | Freq: Every day | ORAL | 12 refills | Status: DC

## 2023-02-24 MED ORDER — FINASTERIDE 5 MG PO TABS: 5.0000 mg | ORAL_TABLET | Freq: Every morning | ORAL | Status: DC

## 2023-02-24 MED ORDER — HEPARIN (PORCINE) IN NACL 1000-0.9 UT/500ML-% IV SOLN
INTRAVENOUS | Status: AC
  Filled 2023-02-24: qty 1000

## 2023-02-24 MED ORDER — DOCUSATE SODIUM 100 MG PO CAPS: 200.0000 mg | ORAL_CAPSULE | Freq: Every day | ORAL | Status: DC

## 2023-02-24 MED ORDER — MIDAZOLAM HCL 2 MG/2ML IJ SOLN
INTRAMUSCULAR | Status: AC
Start: 2023-02-24 — End: ?
  Filled 2023-02-24: qty 2

## 2023-02-24 MED ORDER — ASPIRIN 81 MG PO CHEW
81.0000 mg | CHEWABLE_TABLET | ORAL | Status: AC
  Administered 2023-02-24: 81 mg via ORAL

## 2023-02-24 SURGICAL SUPPLY — 12 items
CATH 5FR JL3.5 JR4 ANG PIG MP (CATHETERS) IMPLANT
CATH INFINITI 5FR JL4 (CATHETERS) IMPLANT
CATH INFINITI JR4 5F (CATHETERS) IMPLANT
DEVICE RAD TR BAND REGULAR (VASCULAR PRODUCTS) IMPLANT
DRAPE BRACHIAL (DRAPES) IMPLANT
GLIDESHEATH SLEND SS 6F .021 (SHEATH) IMPLANT
GUIDEWIRE INQWIRE 1.5J.035X260 (WIRE) IMPLANT
INQWIRE 1.5J .035X260CM (WIRE) ×2
PACK CARDIAC CATH (CUSTOM PROCEDURE TRAY) ×1 IMPLANT
PROTECTION STATION PRESSURIZED (MISCELLANEOUS) ×1
SET ATX-X65L (MISCELLANEOUS) IMPLANT
STATION PROTECTION PRESSURIZED (MISCELLANEOUS) IMPLANT

## 2023-02-24 NOTE — Progress Notes (Signed)
PHARMACY - ANTICOAGULATION CONSULT NOTE  Pharmacy Consult for heparin drip Indication: atrial fibrillation   (s/p Cath, to transfer to [facility] for possible CABG)  No Known Allergies  Patient Measurements: Height: 6' (182.9 cm) Weight: 99.3 kg (219 lb) IBW/kg (Calculated) : 77.6 Heparin Dosing Weight: 98 kg  Vital Signs: Temp: 97.5 F (36.4 C) (12/04 0956) Temp Source: Oral (12/04 0956) BP: 126/81 (12/04 1500) Pulse Rate: 66 (12/04 1500)  Labs: No results for input(s): "HGB", "HCT", "PLT", "APTT", "LABPROT", "INR", "HEPARINUNFRC", "HEPRLOWMOCWT", "CREATININE", "CKTOTAL", "CKMB", "TROPONINIHS" in the last 72 hours.  CrCl cannot be calculated (No successful lab value found.).   Medical History: Past Medical History:  Diagnosis Date   Arthritis    left hip - prior to replacement   Atrial fibrillation (HCC)    GERD (gastroesophageal reflux disease)    Heart murmur    heard when in HS. not mentioned since.   Hypercholesteremia    Motion sickness    deep sea fishing   Stroke (HCC) 2015   No deficitis   Wears hearing aid in both ears     Medications:  Medications Prior to Admission  Medication Sig Dispense Refill Last Dose   apixaban (ELIQUIS) 5 MG TABS tablet Take 5 mg by mouth 2 (two) times daily.   02/20/2023   Cholecalciferol (VITAMIN D-3 PO) Take by mouth in the morning.   02/23/2023   docusate sodium (COLACE) 100 MG capsule Take 200 mg by mouth in the morning.      famotidine (PEPCID) 20 MG tablet Take 20 mg by mouth 2 (two) times daily.   02/24/2023   ferrous sulfate 325 (65 FE) MG tablet Take 325 mg by mouth in the morning.   02/23/2023   finasteride (PROSCAR) 5 MG tablet Take 5 mg by mouth in the morning.   02/23/2023   hydrALAZINE (APRESOLINE) 25 MG tablet Take 25 mg by mouth in the morning and at bedtime.   02/24/2023   hydrochlorothiazide (HYDRODIURIL) 12.5 MG tablet Take 12.5 mg by mouth in the morning.   02/24/2023   isosorbide mononitrate (IMDUR) 60 MG 24 hr tablet  Take 60 mg by mouth in the morning.   02/24/2023   lisinopril (ZESTRIL) 40 MG tablet Take 40 mg by mouth in the morning.   02/24/2023   Multiple Vitamin (MULTIVITAMIN WITH MINERALS) TABS tablet Take 1 tablet by mouth in the morning.   02/23/2023   psyllium (REGULOID) 0.52 g capsule Take 1.04 g by mouth in the morning.      tamsulosin (FLOMAX) 0.4 MG CAPS capsule Take 0.8 mg by mouth daily after supper.   02/23/2023   Scheduled:   [START ON 02/25/2023] aspirin EC  81 mg Oral Daily   atorvastatin  80 mg Oral QHS   docusate sodium  200 mg Oral QHS   famotidine  20 mg Oral BID   ferrous sulfate  325 mg Oral QHS   [START ON 02/25/2023] finasteride  5 mg Oral q AM   heparin  2,000 Units Intravenous Once   hydrALAZINE  25 mg Oral BID   hydrochlorothiazide  12.5 mg Oral QHS   [START ON 02/25/2023] isosorbide mononitrate  60 mg Oral q AM   lisinopril  40 mg Oral QHS   sodium chloride flush  3 mL Intravenous Q12H   tamsulosin  0.8 mg Oral QPC supper   Infusions:   sodium chloride     sodium chloride     sodium chloride     heparin  Assessment: 89 yo M to start Heparin drip for Atrial Fibrillation.  Patient on apixaban PTA for afib and now s/p Cardiac Cath with intended transfer to [facility] for possible CABG. Last dose on apixaban PTA 02/20/23 am (verified with RN) Received Heparin subQ 5000 units 12/4 @1151  per [facility] Baseline labs pending:  CBC, aPTT, Heparin level  Goal of Therapy:  Heparin level 0.3-0.7 units/ml aPTT 66-102 seconds Monitor platelets by anticoagulation protocol: Yes   Plan:  Reduced bolus of 2000 units x1 due to recent heparin given in Cath,  Start heparin infusion at 1350 units/hr Check anti-Xa level in 8 hours and daily while on heparin CBC daily  Bari Mantis PharmD Clinical Pharmacist 02/24/2023'

## 2023-02-24 NOTE — Discharge Summary (Signed)
Discharge Summary       Patient ID: Cesar White MRN: [id number] DOB/AGE: 1931/04/15 89 y.o.  Admit date: 02/24/2023 Discharge date: 02/24/2023  Primary Discharge Diagnosis Abnormal cardiac function test Secondary Discharge Diagnosis Multivessel coronary artery disease   Significant Diagnostic Studies: Left heart catheterization (results below)  Consults: None  Hospital Course: The patient was brought to the cardiac cath lab and underwent left heart catheterization and coronary angiography with Dr. Juliann Pares on 02/24/23. The patient tolerated with procedure well without complications.  Heart catheterization revealed two-vessel coronary artery disease involving left main, 99% ostial LAD lesion, 50-70% ostial circumflex lesion, 90% mid PDA lesion.  Patient will be transferred to [facility] for consideration of coronary bypass surgery versus complex intervention.  Case was discussed by Dr. Juliann Pares with Dr. Marlynn Perking, accepting physician will be Dr. Shea Evans. Patient will be transferred to [facility] today, 02/24/23. Will plan for follow up in office in 1 week of discharge from [facility] with Dr. Juliann Pares, or sooner if needed.      Discharge Exam: Blood pressure 126/81, pulse 66, temperature (!) 97.5 F (36.4 C), temperature source Oral, resp. rate 20, height 6' (1.829 m), weight 99.3 kg, SpO2 98%.    General: Well appearing elderly male, well nourished, in no acute distress sitting upright in hospital bed with wife present at bedside.  HEENT:  Normocephalic and atraumatic. Neck:   No JVD.  Lungs: Normal respiratory effort on room air.  Clear to ascultation bilaterally. Heart: HRRR . Normal S1 and S2 without gallops or murmurs.  Abdomen: non-distended appearing.  Msk: Normal strength and tone for age. Extremities: No peripheral edema. R radial access site without bleeding, significant tenderness to palpation, apparent aneurysm, or significant  ecchymosis. Covered with clean gauze and tegaderm dressing.  Neuro: Alert and oriented x3 Psych:  calm and cooperative.   Labs:  No results found for: "WBC", "HGB", "HCT", "MCV", "PLT" No results for input(s): "NA", "K", "CL", "CO2", "BUN", "CREATININE", "CALCIUM", "PROT", "BILITOT", "ALKPHOS", "ALT", "AST", "GLUCOSE" in the last 168 hours.  Invalid input(s): "LABALBU"    Radiology: None EKG: atrial fibrillation rate 59 bpm  FOLLOW UP PLANS AND APPOINTMENTS  Allergies as of 02/24/2023   No Known Allergies      Medication List     TAKE these medications    aspirin EC 81 MG tablet Take 1 tablet (81 mg total) by mouth daily. Swallow whole. Start taking on: February 25, 2023   atorvastatin 80 MG tablet Commonly known as: LIPITOR Take 1 tablet (80 mg total) by mouth at bedtime.   docusate sodium 100 MG capsule Commonly known as: COLACE Take 200 mg by mouth in the morning.   Eliquis 5 MG Tabs tablet Generic drug: apixaban Take 5 mg by mouth 2 (two) times daily.   famotidine 20 MG tablet Commonly known as: PEPCID Take 20 mg by mouth 2 (two) times daily.   ferrous sulfate 325 (65 FE) MG tablet Take 325 mg by mouth in the morning.   finasteride 5 MG tablet Commonly known as: PROSCAR Take 5 mg by mouth in the morning.   hydrALAZINE 25 MG tablet Commonly known as: APRESOLINE Take 25 mg by mouth in the morning and at bedtime.   hydrochlorothiazide 12.5 MG tablet Commonly known as: HYDRODIURIL Take 12.5 mg by mouth in the morning.   isosorbide mononitrate 60 MG 24 hr tablet Commonly known as: IMDUR Take 60 mg by mouth in the morning.   lisinopril 40  MG tablet Commonly known as: ZESTRIL Take 40 mg by mouth in the morning.   multivitamin with minerals Tabs tablet Take 1 tablet by mouth in the morning.   psyllium 0.52 g capsule Commonly known as: REGULOID Take 1.04 g by mouth in the morning.   tamsulosin 0.4 MG Caps capsule Commonly known as: FLOMAX Take 0.8  mg by mouth daily after supper.   VITAMIN D-3 PO Take by mouth in the morning.        Follow-up Information     Alwyn Pea, MD. Go in 1 week(s).   Specialties: Cardiology, Internal Medicine Contact information: [address] [place] [address] [phone]                PLEASE BRING ALL MEDICATIONS WITH YOU TO FOLLOW UP APPOINTMENTS  Time spent with patient: 64  Signed: Gale Journey PA-C 02/24/2023, 4:07 PM [facility] Cardiology

## 2023-02-24 NOTE — Progress Notes (Signed)
Transport is here to take the patient to [facility] for evaluation for coronary bypass surgery versus complex high risk PCI. Patient stable risk and benefits of transport have been discussed and patient understands and agrees Images have already been sent to [facility] Discharge summary Cath note completed

## 2023-02-24 NOTE — CV Procedure (Signed)
Brief post cath note Outpatient cardiac cath for unstable angina positive imaging study  Right radial approach  Left ventriculogram Mild left ventricular enlargement EF around 40 to 45%  Coronaries Left main large percent distal left main moderate calcification LAD large 99% ostial LAD moderate calcification minor irregularities mid to distal LAD moderate calcification Circumflex large 50 to 70% ostial circumflex 25% mid, mild calcification RCA large ectatic 90% mid PDA Right dominant system  Conclusion Two-vessel coronary artery disease including left main would recommend referral to tertiary care center for coronary bypass surgery consideration is complex high risk PCI intervention  Patient tolerated procedure well No complications  Case discussed with interventional cardiology at [facility].  They recommend transfer to an inpatient for consideration of coronary bypass surgery versus complex intervention  Case discussed with Dr. Marlynn Perking who accepts patient in transfer to attending physician Dr Laurence Aly  Will make arrangements to transfer the patient from specials recovery directly [facility]  Full cath note to follow [facility]  Interventional cardiology

## 2023-02-25 ENCOUNTER — Encounter: Payer: Self-pay | Admitting: Internal Medicine

## 2023-06-22 DEATH — deceased
